# Patient Record
Sex: Male | Born: 1960 | Race: White | Hispanic: No | Smoking: Never smoker
Health system: Southern US, Community
[De-identification: ages and names within clinical notes are randomized; demographics above are authoritative.]

## PROBLEM LIST (undated history)

## (undated) DIAGNOSIS — R7989 Other specified abnormal findings of blood chemistry: Secondary | ICD-10-CM

## (undated) DIAGNOSIS — E079 Disorder of thyroid, unspecified: Secondary | ICD-10-CM

## (undated) DIAGNOSIS — K5790 Diverticulosis of intestine, part unspecified, without perforation or abscess without bleeding: Secondary | ICD-10-CM

## (undated) HISTORY — PX: COLONOSCOPY: SHX174

## (undated) HISTORY — DX: Other specified abnormal findings of blood chemistry: R79.89

## (undated) HISTORY — DX: Diverticulosis of intestine, part unspecified, without perforation or abscess without bleeding: K57.90

## (undated) HISTORY — PX: WISDOM TOOTH EXTRACTION: SHX21

## (undated) HISTORY — PX: LUMBAR LAMINECTOMY: SHX95

## (undated) HISTORY — DX: Disorder of thyroid, unspecified: E07.9

---

## 1998-01-27 ENCOUNTER — Emergency Department (HOSPITAL_COMMUNITY): Admission: EM | Admit: 1998-01-27 | Discharge: 1998-01-27 | Payer: Self-pay | Admitting: Emergency Medicine

## 1998-01-27 ENCOUNTER — Encounter: Payer: Self-pay | Admitting: Emergency Medicine

## 2000-02-16 ENCOUNTER — Emergency Department (HOSPITAL_COMMUNITY): Admission: EM | Admit: 2000-02-16 | Discharge: 2000-02-16 | Payer: Self-pay | Admitting: Emergency Medicine

## 2003-03-27 ENCOUNTER — Emergency Department (HOSPITAL_COMMUNITY): Admission: EM | Admit: 2003-03-27 | Discharge: 2003-03-27 | Payer: Self-pay | Admitting: Emergency Medicine

## 2003-04-07 ENCOUNTER — Ambulatory Visit (HOSPITAL_COMMUNITY): Admission: RE | Admit: 2003-04-07 | Discharge: 2003-04-08 | Payer: Self-pay | Admitting: Neurosurgery

## 2004-12-03 ENCOUNTER — Ambulatory Visit: Payer: Self-pay | Admitting: Gastroenterology

## 2004-12-24 ENCOUNTER — Ambulatory Visit: Payer: Self-pay | Admitting: Gastroenterology

## 2005-06-25 ENCOUNTER — Emergency Department (HOSPITAL_COMMUNITY): Admission: EM | Admit: 2005-06-25 | Discharge: 2005-06-25 | Payer: Self-pay | Admitting: Emergency Medicine

## 2006-07-26 ENCOUNTER — Emergency Department (HOSPITAL_COMMUNITY): Admission: EM | Admit: 2006-07-26 | Discharge: 2006-07-26 | Payer: Self-pay | Admitting: Emergency Medicine

## 2007-11-02 DIAGNOSIS — K5730 Diverticulosis of large intestine without perforation or abscess without bleeding: Secondary | ICD-10-CM | POA: Insufficient documentation

## 2007-11-06 ENCOUNTER — Ambulatory Visit: Payer: Self-pay | Admitting: Gastroenterology

## 2007-11-06 DIAGNOSIS — R5383 Other fatigue: Secondary | ICD-10-CM

## 2007-11-06 DIAGNOSIS — R198 Other specified symptoms and signs involving the digestive system and abdomen: Secondary | ICD-10-CM | POA: Insufficient documentation

## 2007-11-06 DIAGNOSIS — R5381 Other malaise: Secondary | ICD-10-CM | POA: Insufficient documentation

## 2007-11-06 DIAGNOSIS — K5900 Constipation, unspecified: Secondary | ICD-10-CM | POA: Insufficient documentation

## 2007-11-06 LAB — CONVERTED CEMR LAB
ALT: 57 units/L — ABNORMAL HIGH (ref 0–53)
AST: 28 units/L (ref 0–37)
Albumin: 4.2 g/dL (ref 3.5–5.2)
Alkaline Phosphatase: 105 units/L (ref 39–117)
BUN: 9 mg/dL (ref 6–23)
Basophils Absolute: 0.1 10*3/uL (ref 0.0–0.1)
Basophils Relative: 1.1 % (ref 0.0–3.0)
CO2: 32 meq/L (ref 19–32)
Calcium: 9.5 mg/dL (ref 8.4–10.5)
Chloride: 107 meq/L (ref 96–112)
Creatinine, Ser: 1 mg/dL (ref 0.4–1.5)
Eosinophils Absolute: 0.1 10*3/uL (ref 0.0–0.7)
Eosinophils Relative: 1.7 % (ref 0.0–5.0)
GFR calc Af Amer: 104 mL/min
GFR calc non Af Amer: 86 mL/min
Glucose, Bld: 94 mg/dL (ref 70–99)
HCT: 44 % (ref 39.0–52.0)
Hemoglobin: 15.5 g/dL (ref 13.0–17.0)
Lymphocytes Relative: 26.7 % (ref 12.0–46.0)
MCHC: 35.3 g/dL (ref 30.0–36.0)
MCV: 89.9 fL (ref 78.0–100.0)
Monocytes Absolute: 0.4 10*3/uL (ref 0.1–1.0)
Monocytes Relative: 7.1 % (ref 3.0–12.0)
Neutro Abs: 3.1 10*3/uL (ref 1.4–7.7)
Neutrophils Relative %: 63.4 % (ref 43.0–77.0)
Platelets: 193 10*3/uL (ref 150–400)
Potassium: 4.9 meq/L (ref 3.5–5.1)
RBC: 4.89 M/uL (ref 4.22–5.81)
RDW: 12.2 % (ref 11.5–14.6)
Sodium: 143 meq/L (ref 135–145)
TSH: 0.7 microintl units/mL (ref 0.35–5.50)
Total Bilirubin: 0.7 mg/dL (ref 0.3–1.2)
Total Protein: 7 g/dL (ref 6.0–8.3)
WBC: 5.1 10*3/uL (ref 4.5–10.5)

## 2007-11-13 ENCOUNTER — Ambulatory Visit: Payer: Self-pay | Admitting: Gastroenterology

## 2008-08-07 ENCOUNTER — Ambulatory Visit: Payer: Self-pay | Admitting: Gastroenterology

## 2008-08-07 LAB — CONVERTED CEMR LAB
ALT: 45 units/L (ref 0–53)
AST: 34 units/L (ref 0–37)
Albumin: 4.2 g/dL (ref 3.5–5.2)
Alkaline Phosphatase: 85 units/L (ref 39–117)
Basophils Absolute: 0 10*3/uL (ref 0.0–0.1)
Basophils Relative: 0.5 % (ref 0.0–3.0)
Bilirubin, Direct: 0.1 mg/dL (ref 0.0–0.3)
Eosinophils Absolute: 0.1 10*3/uL (ref 0.0–0.7)
Eosinophils Relative: 1.4 % (ref 0.0–5.0)
HCT: 44.3 % (ref 39.0–52.0)
Hemoglobin: 15.6 g/dL (ref 13.0–17.0)
Lymphocytes Relative: 25.9 % (ref 12.0–46.0)
Lymphs Abs: 1.2 10*3/uL (ref 0.7–4.0)
MCHC: 35.3 g/dL (ref 30.0–36.0)
MCV: 90.5 fL (ref 78.0–100.0)
Monocytes Absolute: 0.4 10*3/uL (ref 0.1–1.0)
Monocytes Relative: 7.7 % (ref 3.0–12.0)
Neutro Abs: 2.9 10*3/uL (ref 1.4–7.7)
Neutrophils Relative %: 64.5 % (ref 43.0–77.0)
Platelets: 161 10*3/uL (ref 150.0–400.0)
RBC: 4.9 M/uL (ref 4.22–5.81)
RDW: 12.2 % (ref 11.5–14.6)
Total Bilirubin: 1 mg/dL (ref 0.3–1.2)
Total Protein: 7.1 g/dL (ref 6.0–8.3)
WBC: 4.6 10*3/uL (ref 4.5–10.5)

## 2010-07-16 NOTE — Op Note (Signed)
NAME:  Grant Cunningham, Grant Cunningham                         ACCOUNT NO.:  [id number]   MEDICAL RECORD NO.:  [id number]                   PATIENT TYPE:  OIB   LOCATION:  6715                                 FACILITY:  [facility]   PHYSICIAN:  Kathaleen Maser. Pool, M.D.                 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   PROCEDURE:  Reexploration of right L5-S1 laminotomy with redo  microdiskectomy.   SURGEON:  Kathaleen Maser. Pool, M.D.   ASSISTANT:  Donalee Citrin, M.D.   ANESTHESIA:  General.   INDICATIONS FOR PROCEDURE:  [name] is a 50 year old male with a history  of  a previous  right-sided L5-S1 laminotomy and microdiskectomy remotely  done in the past. The patient presents now with  acute recurrence of his  right lower extremity radicular pain, paresthesias and weakness because of  the right-sided S1 radiculopathy. MRI scanning demonstrates evidence of a  focal recurrent disk  herniation at L5-S1 causing  marked compression of the  right-sided S1 nerve root. We discussed  the options of operative  management. The patient decided to proceed with a right-sided L5-S1  laminotomy and microdiskectomy in hopes of improvement of his symptoms.   DESCRIPTION OF PROCEDURE:  The patient was brought to the operating room and  placed in the supine position. An adequate level of anesthesia was achieved.  The patient was placed prone onto the Wilson frame and artery padded. The  patient's lumbar region was prepped and draped sterilely.   A #10 blade was used to make a linear skin incision around the L5-S1  interspace. This was carried down sharply in the midline. Subperiosteal  dissection was performed to the lamina of the facet  joint of L5 and S1 on  the right side. A deep self-retaining retractor was placed. Intraoperative x-  ray was taken and the level  was confirmed.   The patient's previous laminotomy site was then dissected free using dental  instruments. The microscope was brought into the field and used for  microdissection of the right-sided nerve root and underlying disk  herniation. The epidural venous plexus was coagulated and cut.   The right-sided nerve root was identified just medial to the right-sided S1  pedicle. This was gently mobilized toward the midline. A moderately sized  free fragment disk  herniation  was encountered and dissected free. This was  then removed using pituitary rongeurs. The disk  itself was quite flat.  There was no obvious defect in the annulus. The MRI scan was reviewed once  again and the overall disk  morphology looked good.   With that in mind, it was decided not to  do any more of a formal disk  cleanout. The canal was inspected and found to be free from any further  compression or evidence of injury to the  thecal sac or nerve roots. The  wound was then irrigated with antibiotic solution and Gelfoam was placed for  operative hemostasis which was found to be good.   The wound was then closed in the typical fashion. Steri-Strips and sterile  dressing were applied to the surface. There were no apparent complications.  The patient tolerated the procedure well and he returned to the recovery  room postoperatively.                                               Henry A. Pool, M.D.    HAP/MEDQ  D:  04/07/2003  T:  04/08/2003  Job:  [id number]

## 2010-10-25 ENCOUNTER — Encounter: Payer: Self-pay | Admitting: Gastroenterology

## 2010-11-05 ENCOUNTER — Other Ambulatory Visit: Payer: Self-pay | Admitting: Gastroenterology

## 2010-11-18 ENCOUNTER — Encounter: Payer: Self-pay | Admitting: Gastroenterology

## 2010-11-18 ENCOUNTER — Ambulatory Visit (AMBULATORY_SURGERY_CENTER): Payer: BC Managed Care – PPO | Admitting: *Deleted

## 2010-11-18 VITALS — Ht 72.0 in | Wt 184.1 lb

## 2010-11-18 DIAGNOSIS — Z1211 Encounter for screening for malignant neoplasm of colon: Secondary | ICD-10-CM

## 2010-11-18 MED ORDER — PEG-KCL-NACL-NASULF-NA ASC-C 100 G PO SOLR: ORAL | Status: DC

## 2010-12-02 ENCOUNTER — Encounter: Payer: Self-pay | Admitting: Gastroenterology

## 2010-12-02 ENCOUNTER — Ambulatory Visit (AMBULATORY_SURGERY_CENTER): Payer: BC Managed Care – PPO | Admitting: Gastroenterology

## 2010-12-02 VITALS — BP 118/70 | HR 80 | Temp 97.4°F | Resp 20 | Ht 72.0 in | Wt 184.0 lb

## 2010-12-02 DIAGNOSIS — Z1211 Encounter for screening for malignant neoplasm of colon: Secondary | ICD-10-CM

## 2010-12-02 DIAGNOSIS — Z800 Family history of malignant neoplasm of digestive organs: Secondary | ICD-10-CM

## 2010-12-02 MED ORDER — SODIUM CHLORIDE 0.9 % IV SOLN: 500.0000 mL | INTRAVENOUS | Status: DC

## 2010-12-02 NOTE — Patient Instructions (Signed)
Please refer to your blue and neon green sheets for instructions regarding diet and activity for the rest of today.  You may resume your medications as you would normally take them.   Diverticulosis Diverticulosis is a common condition that develops when small pouches (diverticula) form in the wall of the colon . The risk of diverticulosis increases with age. It happens more often in people who eat a low-fiber diet. Most individuals with diverticulosis have no symptoms. Those individuals with symptoms usually experience belly (abdominal) pain, constipation, or loose stools (diarrhea).  Eating a high-fiber diet has been shown to reduce discomfort and other symptoms of diverticulosis. Fiber may also slow the progression of diverticulosis. Foods having high fiber content include:  Baked beans, kidney beans, split peas, lentils.   Bran cereals, shredded wheat, bran muffins, whole grain breads.   Fresh fruit, raisins, prunes.   Potatoes (with skin), broccoli, spinach, zucchini.  You may feel a little bloated when you introduce more fiber into your diet. These symptoms usually pass in time. Drink at least 6 to 8 glasses of water each day to prevent constipation. Try not to strain when you have a bowel movement. Some caregivers may recommend avoiding nuts and seeds to prevent complications of diverticulosis, although this is still an uncertain benefit. Mild pain medicine may help soothe pain and spasms. Only take over-the-counter or prescription medicines for pain, discomfort, or fever as directed by your caregiver. Some complications of diverticulosis include an infection of the diverticula (diverticulitis), rectal bleeding, or blockage of the colon (bowel obstruction). SEEK IMMEDIATE MEDICAL CARE IF:  You develop increasing pain or severe bloating.   You have an increased oral temperature, not controlled by medicine.   You develop vomiting or bowel movements that are bloody or black.  Document  Released: 02/14/2005 Document Re-Released: 08/04/2009 [facility] Patient Information 2011 [place], [place].

## 2010-12-03 ENCOUNTER — Telehealth: Payer: Self-pay | Admitting: *Deleted

## 2010-12-03 NOTE — Telephone Encounter (Signed)

## 2012-03-12 ENCOUNTER — Other Ambulatory Visit: Payer: Self-pay | Admitting: Neurosurgery

## 2012-03-12 DIAGNOSIS — M47812 Spondylosis without myelopathy or radiculopathy, cervical region: Secondary | ICD-10-CM

## 2012-03-15 ENCOUNTER — Ambulatory Visit
Admission: RE | Admit: 2012-03-15 | Discharge: 2012-03-15 | Disposition: A | Discharge status: Home / Self Care | Payer: BC Managed Care – PPO | Source: Ambulatory Visit | Attending: Neurosurgery | Admitting: Neurosurgery

## 2012-03-15 DIAGNOSIS — M47812 Spondylosis without myelopathy or radiculopathy, cervical region: Secondary | ICD-10-CM

## 2013-10-25 ENCOUNTER — Encounter: Payer: Self-pay | Admitting: Gastroenterology

## 2014-02-12 ENCOUNTER — Ambulatory Visit (AMBULATORY_SURGERY_CENTER): Payer: Self-pay | Admitting: *Deleted

## 2014-02-12 VITALS — Ht 72.0 in | Wt 180.4 lb

## 2014-02-12 DIAGNOSIS — Z800 Family history of malignant neoplasm of digestive organs: Secondary | ICD-10-CM

## 2014-02-12 MED ORDER — MOVIPREP 100 G PO SOLR: 1.0000 | Freq: Once | ORAL | Status: DC

## 2014-02-12 NOTE — Progress Notes (Signed)
No egg or soy allergy. ewm No problems with past sedation. ewm No home 02 use. ewm No diet pills, no blood thinners. ewm emmi video to pt;;s e mail. ewm

## 2014-02-26 ENCOUNTER — Ambulatory Visit (AMBULATORY_SURGERY_CENTER): Payer: BC Managed Care – PPO | Admitting: Gastroenterology

## 2014-02-26 ENCOUNTER — Encounter: Payer: Self-pay | Admitting: Gastroenterology

## 2014-02-26 VITALS — BP 111/65 | HR 67 | Temp 97.5°F | Resp 23 | Ht 72.0 in | Wt 180.0 lb

## 2014-02-26 DIAGNOSIS — Z1211 Encounter for screening for malignant neoplasm of colon: Secondary | ICD-10-CM

## 2014-02-26 DIAGNOSIS — Z800 Family history of malignant neoplasm of digestive organs: Secondary | ICD-10-CM

## 2014-02-26 MED ORDER — SODIUM CHLORIDE 0.9 % IV SOLN: 500.0000 mL | INTRAVENOUS | Status: DC

## 2014-02-26 NOTE — Patient Instructions (Signed)

## 2014-02-26 NOTE — Progress Notes (Signed)
No problems noted in the recovery room. maw 

## 2014-02-26 NOTE — Op Note (Signed)
[facility]  [address] [place], [id number]   COLONOSCOPY PROCEDURE REPORT  PATIENT: Grant Cunningham, Grant Cunningham  MR#: [id number] BIRTHDATE: 1960-05-14 , 73  yrs. old GENDER: male ENDOSCOPIST: Ladene Artist, MD, [facility] PROCEDURE DATE:  02/26/2014 PROCEDURE:   Colonoscopy, screening First Screening Colonoscopy - Avg.  risk and is 50 yrs.  old or older - No.  Prior Negative Screening - Now for repeat screening. N/A  History of Adenoma - Now for follow-up colonoscopy & has been > or = to 3 yrs.  N/A  Polyps Removed Today? No.  Polyps Removed Today? No.  Recommend repeat exam, <10 yrs? Polyps Removed Today? No.  Recommend repeat exam, <10 yrs? Yes.  Polyps Removed Today? No.  Recommend repeat exam, <10 yrs? Yes.  High risk (family or personal hx). ASA CLASS:   Class II INDICATIONS:patient's immediate family history of colon cancer-mother and brother MEDICATIONS: Monitored anesthesia care and Propofol 200 mg IV DESCRIPTION OF PROCEDURE:   After the risks benefits and alternatives of the procedure were thoroughly explained, informed consent was obtained.  The digital rectal exam revealed no abnormalities of the rectum.   The LB [id number]  endoscope was introduced through the anus and advanced to the cecum, which was identified by both the appendix and ileocecal valve. No adverse events experienced.   The quality of the prep was good, using MoviPrep  The instrument was then slowly withdrawn as the colon was fully examined.  COLON FINDINGS: There was mild diverticulosis noted in the sigmoid colon and at the hepatic flexure.   The examination was otherwise normal.  Retroflexed views revealed no abnormalities. The time to cecum=3 minutes 56 seconds.  Withdrawal time=10 minutes 03 seconds. The scope was withdrawn and the procedure completed. COMPLICATIONS: There were no immediate complications.  ENDOSCOPIC IMPRESSION: 1.   Mild diverticulosis in the sigmoid colon and at  the hepatic flexure 2.   The examination was otherwise normal  RECOMMENDATIONS: 1.  High fiber diet with liberal fluid intake. 2.  Repeat Colonoscopy in 5 years.  eSigned:  Ladene Artist, MD, [facility] 02/26/2014 12:15 PM

## 2014-02-26 NOTE — Progress Notes (Signed)
Report to PACU, RN, vss, BBS= Clear.  

## 2014-03-03 ENCOUNTER — Telehealth: Payer: Self-pay | Admitting: *Deleted

## 2014-03-03 NOTE — Telephone Encounter (Signed)
  Follow up Call-  Call back number 02/26/2014  Post procedure Call Back phone  # [phone]  Permission to leave phone message Yes     Patient questions:  Do you have a fever, pain , or abdominal swelling? No. Pain Score  0 *  Have you tolerated food without any problems? Yes.    Have you been able to return to your normal activities? Yes.    Do you have any questions about your discharge instructions: Diet   No. Medications  No. Follow up visit  No.  Do you have questions or concerns about your Care? No.  Actions: * If pain score is 4 or above: No action needed, pain <4.

## 2014-05-15 ENCOUNTER — Encounter (HOSPITAL_BASED_OUTPATIENT_CLINIC_OR_DEPARTMENT_OTHER): Payer: Self-pay | Admitting: *Deleted

## 2014-05-15 ENCOUNTER — Emergency Department (HOSPITAL_BASED_OUTPATIENT_CLINIC_OR_DEPARTMENT_OTHER)
Admission: EM | Admit: 2014-05-15 | Discharge: 2014-05-15 | Disposition: A | Discharge status: Home / Self Care | Payer: BLUE CROSS/BLUE SHIELD | Attending: Emergency Medicine | Admitting: Emergency Medicine

## 2014-05-15 DIAGNOSIS — Y998 Other external cause status: Secondary | ICD-10-CM | POA: Insufficient documentation

## 2014-05-15 DIAGNOSIS — S0101XA Laceration without foreign body of scalp, initial encounter: Secondary | ICD-10-CM | POA: Diagnosis not present

## 2014-05-15 DIAGNOSIS — Z23 Encounter for immunization: Secondary | ICD-10-CM | POA: Insufficient documentation

## 2014-05-15 DIAGNOSIS — Y9389 Activity, other specified: Secondary | ICD-10-CM | POA: Insufficient documentation

## 2014-05-15 DIAGNOSIS — Y9289 Other specified places as the place of occurrence of the external cause: Secondary | ICD-10-CM | POA: Diagnosis not present

## 2014-05-15 DIAGNOSIS — W228XXA Striking against or struck by other objects, initial encounter: Secondary | ICD-10-CM | POA: Diagnosis not present

## 2014-05-15 DIAGNOSIS — Z79899 Other long term (current) drug therapy: Secondary | ICD-10-CM | POA: Diagnosis not present

## 2014-05-15 MED ORDER — LIDOCAINE-EPINEPHRINE 2 %-1:100000 IJ SOLN
10.0000 mL | Freq: Once | INTRAMUSCULAR | Status: AC
  Administered 2014-05-15: 10 mL via INTRADERMAL
  Filled 2014-05-15: qty 1

## 2014-05-15 MED ORDER — TRAMADOL HCL 50 MG PO TABS: 50.0000 mg | ORAL_TABLET | Freq: Four times a day (QID) | ORAL | Status: DC | PRN

## 2014-05-15 MED ORDER — TETANUS-DIPHTH-ACELL PERTUSSIS 5-2.5-18.5 LF-MCG/0.5 IM SUSP
0.5000 mL | Freq: Once | INTRAMUSCULAR | Status: AC
  Administered 2014-05-15: 0.5 mL via INTRAMUSCULAR
  Filled 2014-05-15: qty 0.5

## 2014-05-15 NOTE — ED Notes (Addendum)
Pt sts he was driving a T post into the ground when one side of the "T" struck him in the top of the head, lacerating him.

## 2014-05-15 NOTE — ED Notes (Signed)
Directed to pharmacy to pick up prescriptions

## 2014-05-15 NOTE — Discharge Instructions (Signed)
Please return in 5-7 days for staples removal.  Follow instruction below.    Laceration Care, Adult A laceration is a cut or lesion that goes through all layers of the skin and into the tissue just beneath the skin. TREATMENT  Some lacerations may not require closure. Some lacerations may not be able to be closed due to an increased risk of infection. It is important to see your caregiver as soon as possible after an injury to minimize the risk of infection and maximize the opportunity for successful closure. If closure is appropriate, pain medicines may be given, if needed. The wound will be cleaned to help prevent infection. Your caregiver will use stitches (sutures), staples, wound glue (adhesive), or skin adhesive strips to repair the laceration. These tools bring the skin edges together to allow for faster healing and a better cosmetic outcome. However, all wounds will heal with a scar. Once the wound has healed, scarring can be minimized by covering the wound with sunscreen during the day for 1 full year. HOME CARE INSTRUCTIONS  For sutures or staples:  Keep the wound clean and dry.  If you were given a bandage (dressing), you should change it at least once a day. Also, change the dressing if it becomes wet or dirty, or as directed by your caregiver.  Wash the wound with soap and water 2 times a day. Rinse the wound off with water to remove all soap. Pat the wound dry with a clean towel.  After cleaning, apply a thin layer of the antibiotic ointment as recommended by your caregiver. This will help prevent infection and keep the dressing from sticking.  You may shower as usual after the first 24 hours. Do not soak the wound in water until the sutures are removed.  Only take over-the-counter or prescription medicines for pain, discomfort, or fever as directed by your caregiver.  Get your sutures or staples removed as directed by your caregiver. For skin adhesive strips:  Keep the wound  clean and dry.  Do not get the skin adhesive strips wet. You may bathe carefully, using caution to keep the wound dry.  If the wound gets wet, pat it dry with a clean towel.  Skin adhesive strips will fall off on their own. You may trim the strips as the wound heals. Do not remove skin adhesive strips that are still stuck to the wound. They will fall off in time. For wound adhesive:  You may briefly wet your wound in the shower or bath. Do not soak or scrub the wound. Do not swim. Avoid periods of heavy perspiration until the skin adhesive has fallen off on its own. After showering or bathing, gently pat the wound dry with a clean towel.  Do not apply liquid medicine, cream medicine, or ointment medicine to your wound while the skin adhesive is in place. This may loosen the film before your wound is healed.  If a dressing is placed over the wound, be careful not to apply tape directly over the skin adhesive. This may cause the adhesive to be pulled off before the wound is healed.  Avoid prolonged exposure to sunlight or tanning lamps while the skin adhesive is in place. Exposure to ultraviolet light in the first year will darken the scar.  The skin adhesive will usually remain in place for 5 to 10 days, then naturally fall off the skin. Do not pick at the adhesive film. You may need a tetanus shot if:  You cannot  remember when you had your last tetanus shot.  You have never had a tetanus shot. If you get a tetanus shot, your arm may swell, get red, and feel warm to the touch. This is common and not a problem. If you need a tetanus shot and you choose not to have one, there is a rare chance of getting tetanus. Sickness from tetanus can be serious. SEEK MEDICAL CARE IF:   You have redness, swelling, or increasing pain in the wound.  You see a red line that goes away from the wound.  You have yellowish-white fluid (pus) coming from the wound.  You have a fever.  You notice a bad smell  coming from the wound or dressing.  Your wound breaks open before or after sutures have been removed.  You notice something coming out of the wound such as wood or glass.  Your wound is on your hand or foot and you cannot move a finger or toe. SEEK IMMEDIATE MEDICAL CARE IF:   Your pain is not controlled with prescribed medicine.  You have severe swelling around the wound causing pain and numbness or a change in color in your arm, hand, leg, or foot.  Your wound splits open and starts bleeding.  You have worsening numbness, weakness, or loss of function of any joint around or beyond the wound.  You develop painful lumps near the wound or on the skin anywhere on your body. MAKE SURE YOU:   Understand these instructions.  Will watch your condition.  Will get help right away if you are not doing well or get worse. Document Released: 02/14/2005 Document Revised: 05/09/2011 Document Reviewed: 08/10/2010 [facility] Patient Information 2015 [place], [place]. This information is not intended to replace advice given to you by your health care provider. Make sure you discuss any questions you have with your health care provider.

## 2014-05-15 NOTE — ED Provider Notes (Signed)
CSN: [id number]     Arrival date & time 05/15/14  1123 History   First MD Initiated Contact with Patient 05/15/14 1207     Chief Complaint  Patient presents with  . Head Laceration     (Consider location/radiation/quality/duration/timing/severity/associated sxs/prior Treatment) HPI   54 year old male who presents for evaluation of head injury. Pt was building a fence for his horse at home today and approximately 2 hours ago as he was driving a post to the ground he accidentally injured this vertex of his scalp when he lost control of his tool which struck him in the head.  He reports acute onset of sharp pain with bleeding. He was able to apply pressure to the bleed and come to the ER. He denies any loss of consciousness. His pain is minimal at this time. He does not recall his last tetanus status. He denies any other injury. No numbness weakness neck pain or any other symptoms. He is not on blood thinner medication.  History reviewed. No pertinent past medical history. Past Surgical History  Procedure Laterality Date  . Lumbar laminectomy  1999, 2005    L5, S1  . Colonoscopy    . Wisdom tooth extraction     Family History  Problem Relation Age of Onset  . Colon cancer Mother 29  . Colon cancer Brother 6  . Lung cancer Father   . Colon polyps Brother    History  Substance Use Topics  . Smoking status: Never Smoker   . Smokeless tobacco: Never Used  . Alcohol Use: No     Comment: rare- once every few months    Review of Systems  Constitutional: Negative for fever.  Skin: Positive for wound.  Neurological: Negative for headaches.      Allergies  Review of patient's allergies indicates no known allergies.  Home Medications   Prior to Admission medications   Medication Sig Start Date End Date Taking? Authorizing Provider  cholecalciferol (VITAMIN D) 400 UNITS TABS tablet Take 400 Units by mouth daily.    Historical Provider, MD  Cyanocobalamin (VITAMIN B 12 PO) Take  1 tablet by mouth daily.    Historical Provider, MD  Omega-3 Fatty Acids (SUPER OMEGA 3) 500 MG CAPS Take 1 capsule by mouth daily.    Historical Provider, MD  Testosterone 10 MG/ACT (2%) GEL Place onto the skin.    Historical Provider, MD   BP 118/72 mmHg  Pulse 92  Temp(Src) 98.5 F (36.9 C) (Oral)  Resp 18  Ht 6' (1.829 m)  Wt 175 lb (79.379 kg)  BMI 23.73 kg/m2  SpO2 98% Physical Exam  Constitutional: He appears well-developed and well-nourished. No distress.  HENT:  Head: Atraumatic.  2 cm vertical laceration noted to the vertex of scalp without crepitus or foreign object noted. Minimal tenderness to palpation, not actively bleeding.  Eyes: Conjunctivae are normal.  Neck: Normal range of motion. Neck supple.  Neurological: He is alert. He has normal strength. No cranial nerve deficit or sensory deficit. Gait normal. GCS eye subscore is 4. GCS verbal subscore is 5. GCS motor subscore is 6.  Skin: No rash noted.  Psychiatric: He has a normal mood and affect.    ED Course  Procedures (including critical care time)  Patient here with a mechanical injury, suffering a laceration to the vertex of scalp. Will give tdap, will clean scalp wound and staples.    LACERATION REPAIR Performed by: Domenic Moras Authorized byDomenic Moras Consent: Verbal consent obtained. Risks and benefits:  risks, benefits and alternatives were discussed Consent given by: patient Patient identity confirmed: provided demographic data Prepped and Draped in normal sterile fashion Wound explored  Laceration Location: vertex of scalp  Laceration Length: 2cm  No Foreign Bodies seen or palpated  Anesthesia: local infiltration  Local anesthetic: lidocaine 2% w epinephrine  Anesthetic total: 2 ml  Irrigation method: syringe Amount of cleaning: standard  Skin closure: surgical staples  Number of sutures: 3  Technique: staples  Patient tolerance: Patient tolerated the procedure well with no  immediate complications.   Labs Review Labs Reviewed - No data to display  Imaging Review No results found.   EKG Interpretation None      MDM   Final diagnoses:  Scalp laceration, initial encounter    BP 118/72 mmHg  Pulse 92  Temp(Src) 98.5 F (36.9 C) (Oral)  Resp 18  Ht 6' (1.829 m)  Wt 175 lb (79.379 kg)  BMI 23.73 kg/m2  SpO2 98%     Domenic Moras, PA-C 05/15/14 1258  Sherwood Gambler, MD 05/16/14 [phone]

## 2014-10-02 ENCOUNTER — Encounter: Payer: Self-pay | Admitting: Gastroenterology

## 2015-08-27 ENCOUNTER — Telehealth: Payer: Self-pay | Admitting: Gastroenterology

## 2015-08-27 NOTE — Telephone Encounter (Signed)
Change in bowels, more constipated, does not feel as if he emptys, stools are different sizes at times some are very small caliber some are larger.  No bleeding, no pain,   last colon   2015 ENDOSCOPIC IMPRESSION: 1. Mild diverticulosis in the sigmoid colon and at the hepatic flexure 2. The examination was otherwise normal RECOMMENDATIONS: 1. High fiber diet with liberal fluid intake. 2. Repeat Colonoscopy in 5 years. eSigned: Malcolm  Repeat colon in 5 years  Family history of colon cancer in mother and brother   Pt has been scheduled for appt with Dr Fuller Plan 11/04/15 he will call if he has any further symptoms develops or concerns.

## 2015-10-04 ENCOUNTER — Encounter (HOSPITAL_BASED_OUTPATIENT_CLINIC_OR_DEPARTMENT_OTHER): Payer: Self-pay | Admitting: *Deleted

## 2015-10-04 ENCOUNTER — Emergency Department (HOSPITAL_BASED_OUTPATIENT_CLINIC_OR_DEPARTMENT_OTHER)
Admission: EM | Admit: 2015-10-04 | Discharge: 2015-10-04 | Disposition: A | Discharge status: Home / Self Care | Payer: 59 | Attending: Emergency Medicine | Admitting: Emergency Medicine

## 2015-10-04 DIAGNOSIS — Z79899 Other long term (current) drug therapy: Secondary | ICD-10-CM | POA: Insufficient documentation

## 2015-10-04 DIAGNOSIS — K529 Noninfective gastroenteritis and colitis, unspecified: Secondary | ICD-10-CM | POA: Diagnosis not present

## 2015-10-04 DIAGNOSIS — R112 Nausea with vomiting, unspecified: Secondary | ICD-10-CM | POA: Diagnosis present

## 2015-10-04 LAB — COMPREHENSIVE METABOLIC PANEL
ALT: 46 U/L (ref 17–63)
AST: 26 U/L (ref 15–41)
Albumin: 4.5 g/dL (ref 3.5–5.0)
Alkaline Phosphatase: 82 U/L (ref 38–126)
Anion gap: 9 (ref 5–15)
BUN: 17 mg/dL (ref 6–20)
CHLORIDE: 101 mmol/L (ref 101–111)
CO2: 26 mmol/L (ref 22–32)
CREATININE: 1.09 mg/dL (ref 0.61–1.24)
Calcium: 9.2 mg/dL (ref 8.9–10.3)
GFR calc Af Amer: >60 mL/min (ref >60)
GFR calc non Af Amer: >60 mL/min (ref >60)
Glucose, Bld: 148 mg/dL — ABNORMAL HIGH (ref 65–99)
Potassium: 4 mmol/L (ref 3.5–5.1)
SODIUM: 136 mmol/L (ref 135–145)
Total Bilirubin: 1.1 mg/dL (ref 0.3–1.2)
Total Protein: 7.6 g/dL (ref 6.5–8.1)

## 2015-10-04 LAB — CBC
HCT: 46.5 % (ref 39.0–52.0)
Hemoglobin: 16.6 g/dL (ref 13.0–17.0)
MCH: 31.2 pg (ref 26.0–34.0)
MCHC: 35.7 g/dL (ref 30.0–36.0)
MCV: 87.4 fL (ref 78.0–100.0)
PLATELETS: 159 10*3/uL (ref 150–400)
RBC: 5.32 MIL/uL (ref 4.22–5.81)
RDW: 12.7 % (ref 11.5–15.5)
WBC: 11 10*3/uL — ABNORMAL HIGH (ref 4.0–10.5)

## 2015-10-04 LAB — URINALYSIS, ROUTINE W REFLEX MICROSCOPIC
Bilirubin Urine: NEGATIVE
GLUCOSE, UA: NEGATIVE mg/dL
HGB URINE DIPSTICK: NEGATIVE
KETONES UR: 15 mg/dL — AB
Leukocytes, UA: NEGATIVE
Nitrite: NEGATIVE
PROTEIN: NEGATIVE mg/dL
Specific Gravity, Urine: 1.025 (ref 1.005–1.030)
pH: 6.5 (ref 5.0–8.0)

## 2015-10-04 LAB — LIPASE, BLOOD: LIPASE: 24 U/L (ref 11–51)

## 2015-10-04 MED ORDER — ONDANSETRON 4 MG PO TBDP: ORAL_TABLET | ORAL | 0 refills | Status: DC

## 2015-10-04 MED ORDER — ONDANSETRON HCL 4 MG/2ML IJ SOLN
4.0000 mg | Freq: Once | INTRAMUSCULAR | Status: DC | PRN
  Filled 2015-10-04: qty 2

## 2015-10-04 MED ORDER — SODIUM CHLORIDE 0.9 % IV BOLUS (SEPSIS)
1000.0000 mL | Freq: Once | INTRAVENOUS | Status: AC
  Administered 2015-10-04: 1000 mL via INTRAVENOUS

## 2015-10-04 MED ORDER — ONDANSETRON HCL 4 MG/2ML IJ SOLN
4.0000 mg | Freq: Once | INTRAMUSCULAR | Status: AC
  Administered 2015-10-04: 4 mg via INTRAVENOUS

## 2015-10-04 NOTE — ED Provider Notes (Signed)
[facility] DEPT [facility] Provider Note   CSN: [id number] Arrival date & time: 10/04/15  1130  First Provider Contact:  First MD Initiated Contact with Patient 10/04/15 1137        History   Chief Complaint Chief Complaint  Patient presents with  . Diarrhea  . Emesis    HPI Grant Cunningham is a 55 y.o. male.  Patient presents with nausea vomiting and diarrhea. He states he ate some pizza last night and started having diarrhea during the night. He had large amounts of watery, nonbloody diarrhea. This morning he started having nausea and vomiting. He's had multiple episodes of nonbilious, nonbloody vomiting. He denies any abdominal pain. He felt like his heart was racing which is the reason he came to the emergency department. He feels a little bit lightheaded. No chest pain or shortness of breath. No known fevers.      History reviewed. No pertinent past medical history.  Patient Active Problem List   Diagnosis Date Noted  . CONSTIPATION 11/06/2007  . FATIGUE 11/06/2007  . CHANGE IN BOWELS 11/06/2007  . DIVERTICULOSIS OF COLON 11/02/2007    Past Surgical History:  Procedure Laterality Date  . BACK SURGERY    . COLONOSCOPY    . LUMBAR LAMINECTOMY  1999, 2005   L5, S1  . WISDOM TOOTH EXTRACTION         Home Medications    Prior to Admission medications   Medication Sig Start Date End Date Taking? Authorizing Provider  Testosterone 10 MG/ACT (2%) GEL Place onto the skin.   Yes Historical Provider, MD  cholecalciferol (VITAMIN D) 400 UNITS TABS tablet Take 400 Units by mouth daily.    Historical Provider, MD  Cyanocobalamin (VITAMIN B 12 PO) Take 1 tablet by mouth daily.    Historical Provider, MD  Omega-3 Fatty Acids (SUPER OMEGA 3) 500 MG CAPS Take 1 capsule by mouth daily.    Historical Provider, MD  ondansetron (ZOFRAN ODT) 4 MG disintegrating tablet 4mg  ODT q4 hours prn nausea/vomit 10/04/15   Malvin Johns, MD  traMADol (ULTRAM) 50 MG tablet Take 1 tablet (50 mg  total) by mouth every 6 (six) hours as needed for moderate pain. 05/15/14   Domenic Moras, PA-C    Family History Family History  Problem Relation Age of Onset  . Colon cancer Mother 64  . Lung cancer Father   . Colon cancer Brother 65  . Colon polyps Brother     Social History Social History  Substance Use Topics  . Smoking status: Never Smoker  . Smokeless tobacco: Never Used  . Alcohol use No     Comment: rare- once every few months     Allergies   Review of patient's allergies indicates no known allergies.   Review of Systems Review of Systems  Constitutional: Negative for chills, diaphoresis, fatigue and fever.  HENT: Negative for congestion, rhinorrhea and sneezing.   Eyes: Negative.   Respiratory: Negative for cough, chest tightness and shortness of breath.   Cardiovascular: Positive for palpitations. Negative for chest pain and leg swelling.  Gastrointestinal: Positive for diarrhea, nausea and vomiting. Negative for abdominal pain and blood in stool.  Genitourinary: Negative for difficulty urinating, flank pain, frequency and hematuria.  Musculoskeletal: Negative for arthralgias and back pain.  Skin: Negative for rash.  Neurological: Negative for dizziness, speech difficulty, weakness, numbness and headaches.     Physical Exam Updated Vital Signs BP 106/62   Pulse 95   Temp 98.9 F (37.2 C) (  Oral)   Resp 12   Ht 6' (1.829 m)   Wt 180 lb (81.6 kg)   SpO2 98%   BMI 24.41 kg/m   Physical Exam  Constitutional: He is oriented to person, place, and time. He appears well-developed and well-nourished.  HENT:  Head: Normocephalic and atraumatic.  Eyes: Pupils are equal, round, and reactive to light.  Neck: Normal range of motion. Neck supple.  Cardiovascular: Regular rhythm and normal heart sounds.  Tachycardia present.   Pulmonary/Chest: Effort normal and breath sounds normal. No respiratory distress. He has no wheezes. He has no rales. He exhibits no  tenderness.  Abdominal: Soft. Bowel sounds are normal. There is no tenderness. There is no rebound and no guarding.  Musculoskeletal: Normal range of motion. He exhibits no edema.  Lymphadenopathy:    He has no cervical adenopathy.  Neurological: He is alert and oriented to person, place, and time.  Skin: Skin is warm and dry. No rash noted.  Psychiatric: He has a normal mood and affect.     ED Treatments / Results  Labs (all labs ordered are listed, but only abnormal results are displayed) Labs Reviewed  COMPREHENSIVE METABOLIC PANEL - Abnormal; Notable for the following:       Result Value   Glucose, Bld 148 (*)    All other components within normal limits  CBC - Abnormal; Notable for the following:    WBC 11.0 (*)    All other components within normal limits  URINALYSIS, ROUTINE W REFLEX MICROSCOPIC (NOT AT [facility]) - Abnormal; Notable for the following:    Ketones, ur 15 (*)    All other components within normal limits  LIPASE, BLOOD    EKG  EKG Interpretation  Date/Time:  Sunday October 04 2015 11:45:05 EDT Ventricular Rate:  120 PR Interval:    QRS Duration: 87 QT Interval:  322 QTC Calculation: 455 R Axis:   -88 Text Interpretation:  Sinus tachycardia Ventricular premature complex Left axis deviation Probable anterior infarct, old Confirmed by Lita Mains  MD, DAVID ([id number]), editor Rolla Plate, Joelene Millin [phone]) on 10/04/2015 11:54:18 AM       Radiology No results found.  Procedures Procedures (including critical care time)  Medications Ordered in ED Medications  ondansetron (ZOFRAN) injection 4 mg (not administered)  sodium chloride 0.9 % bolus 1,000 mL (1,000 mLs Intravenous New Bag/Given 10/04/15 1437)  ondansetron (ZOFRAN) injection 4 mg (4 mg Intravenous Given 10/04/15 1437)     Initial Impression / Assessment and Plan / ED Course  I have reviewed the triage vital signs and the nursing notes.  Pertinent labs & imaging results that were available during my care of  the patient were reviewed by me and considered in my medical decision making (see chart for details).  Clinical Course    Patient is feeling better after IV fluids and medications in the ED. He's had no further episodes of vomiting. He's tolerating oral fluids. He has no abdominal pain on exam. His labs are non-concerning. I did advise him that his glucose is mildly elevated and he'll need follow-up unless by his PCP. He was advised to return if he has any worsening symptoms.  Final Clinical Impressions(s) / ED Diagnoses   Final diagnoses:  Gastroenteritis    New Prescriptions New Prescriptions   ONDANSETRON (ZOFRAN ODT) 4 MG DISINTEGRATING TABLET    4mg  ODT q4 hours prn nausea/vomit     Malvin Johns, MD 10/04/15 1519

## 2015-10-04 NOTE — ED Triage Notes (Signed)
Pt reports diarrhea started last night after eating pizza then reports N/V starting this morning. Pt reports feeling heart palpitations this morning.

## 2015-11-03 ENCOUNTER — Ambulatory Visit (INDEPENDENT_AMBULATORY_CARE_PROVIDER_SITE_OTHER): Payer: 59 | Admitting: Gastroenterology

## 2015-11-03 ENCOUNTER — Encounter (INDEPENDENT_AMBULATORY_CARE_PROVIDER_SITE_OTHER): Payer: Self-pay

## 2015-11-03 ENCOUNTER — Encounter: Payer: Self-pay | Admitting: Gastroenterology

## 2015-11-03 VITALS — BP 100/40 | HR 68 | Ht 72.0 in | Wt 182.4 lb

## 2015-11-03 DIAGNOSIS — K6289 Other specified diseases of anus and rectum: Secondary | ICD-10-CM | POA: Diagnosis not present

## 2015-11-03 DIAGNOSIS — Z800 Family history of malignant neoplasm of digestive organs: Secondary | ICD-10-CM

## 2015-11-03 NOTE — Progress Notes (Signed)
    History of Present Illness: This is a 55 year old male who relates several episodes of very brief rectal pain that has occurred while sitting or walking. The symptoms only last a few seconds. They're not associated with bowel movements or any other digestive function. He has a long history of slight variations in his stool caliber, occasional diarrhea and these symptoms have not changed. He is concerned because of his family history of colon cancer. Colonoscopy performed within the last 2 years as listed below. Denies weight loss, abdominal pain, melena, hematochezia, nausea, vomiting, dysphagia, reflux symptoms, chest pain.   Colonoscopy 01/2014 ENDOSCOPIC IMPRESSION: 1. Mild diverticulosis in the sigmoid colon and at the hepatic flexure 2. The examination was otherwise normal  Medications, Allergies, Past Medical History, Past Surgical History, Family History and Social History were reviewed in [employer] record.  Physical Exam: General: Well developed, well nourished, no acute distress Head: Normocephalic and atraumatic Eyes:  sclerae anicteric, EOMI Ears: Normal auditory acuity Mouth: No deformity or lesions Lungs: Clear throughout to auscultation Heart: Regular rate and rhythm; no murmurs, rubs or bruits Abdomen: Soft, non tender and non distended. No masses, hepatosplenomegaly or hernias noted. Normal Bowel sounds Musculoskeletal: Symmetrical with no gross deformities  Pulses:  Normal pulses noted Extremities: No clubbing, cyanosis, edema or deformities noted Neurological: Alert oriented x 4, grossly nonfocal Psychological:  Alert and cooperative. Normal mood and affect  Assessment and Recommendations:  1. Brief episode rectal pain. 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 Patient reassured. If symptoms become more prolonged and more severe or new gastrointestinal complaints develop he is advised to return for follow-up.  2. Long-standing variable bowel habits with  occasional thinner stools and diarrhea. Patient can minimize caffeine and lactose. If he associates any food with his symptoms he is advised to avoid. Maintain a high-fiber diet with adequate daily fluid intake. If symptoms become more prolonged and more severe or new gastrointestinal complaints develop he is advised to return for follow-up.  3. Family history of colon cancer-mother and brother. Five-year interval surveillance colonoscopy is recommended in December 2020.

## 2015-11-03 NOTE — Patient Instructions (Signed)
You will be due for a recall colonoscopy in 01/2019. We will send you a reminder in the mail when it gets closer to that time.  Thank you for choosing me and [facility].  Pricilla Riffle. Dagoberto Ligas., MD., Marval Regal

## 2018-03-13 ENCOUNTER — Other Ambulatory Visit (HOSPITAL_BASED_OUTPATIENT_CLINIC_OR_DEPARTMENT_OTHER): Payer: Self-pay | Admitting: Family Medicine

## 2018-03-13 ENCOUNTER — Ambulatory Visit (HOSPITAL_BASED_OUTPATIENT_CLINIC_OR_DEPARTMENT_OTHER)
Admission: RE | Admit: 2018-03-13 | Discharge: 2018-03-13 | Disposition: A | Discharge status: Home / Self Care | Payer: BLUE CROSS/BLUE SHIELD | Source: Ambulatory Visit | Attending: Family Medicine | Admitting: Family Medicine

## 2018-03-13 DIAGNOSIS — R911 Solitary pulmonary nodule: Secondary | ICD-10-CM

## 2018-10-10 ENCOUNTER — Telehealth: Payer: Self-pay | Admitting: Gastroenterology

## 2018-10-10 NOTE — Telephone Encounter (Signed)
Patient had the labs drawn as part of an insurance exam.  He will bring copies with him to his appt on 11/02/18.  Colon and pre-visit scheduled in the [facility].

## 2018-10-10 NOTE — Telephone Encounter (Signed)
Patient has a colonoscopy recall for 01/2019.  Should he have procedure earlier than December?

## 2018-10-10 NOTE — Telephone Encounter (Signed)
Pt reported that recent blood works reveal CEA level of 3.1 ng/mL.  Pt would like to know whether his recall should be moved up.

## 2018-10-10 NOTE — Telephone Encounter (Signed)
Please send a copy of all the blood work he had done recently and please schedule his colonoscopy within the next month or two.

## 2018-11-02 ENCOUNTER — Other Ambulatory Visit: Payer: Self-pay

## 2018-11-02 ENCOUNTER — Encounter: Payer: Self-pay | Admitting: Gastroenterology

## 2018-11-02 ENCOUNTER — Ambulatory Visit (AMBULATORY_SURGERY_CENTER): Payer: Self-pay

## 2018-11-02 VITALS — Ht 72.0 in | Wt 176.6 lb

## 2018-11-02 DIAGNOSIS — Z800 Family history of malignant neoplasm of digestive organs: Secondary | ICD-10-CM

## 2018-11-02 MED ORDER — NA SULFATE-K SULFATE-MG SULF 17.5-3.13-1.6 GM/177ML PO SOLN: 1.0000 | Freq: Once | ORAL | 0 refills | Status: AC

## 2018-11-02 NOTE — Progress Notes (Signed)
Denies allergies to eggs or soy products. Denies complication of anesthesia or sedation. Denies use of weight loss medication. Denies use of O2.   Emmi instructions given for colonoscopy.  A 15.00 coupon for Suprep was given to the patient.  

## 2018-11-14 ENCOUNTER — Telehealth: Payer: Self-pay

## 2018-11-14 NOTE — Telephone Encounter (Signed)
Covid-19 screening questions   Do you now or have you had a fever in the last 14 days?  Do you have any respiratory symptoms of shortness of breath or cough now or in the last 14 days?  Do you have any family members or close contacts with diagnosed or suspected Covid-19 in the past 14 days?  Have you been tested for Covid-19 and found to be positive?       

## 2018-11-14 NOTE — Telephone Encounter (Signed)
Pt answered  "NO" to all covid questions. °

## 2018-11-15 ENCOUNTER — Ambulatory Visit (AMBULATORY_SURGERY_CENTER): Payer: BC Managed Care – PPO | Admitting: Gastroenterology

## 2018-11-15 ENCOUNTER — Other Ambulatory Visit: Payer: Self-pay

## 2018-11-15 ENCOUNTER — Encounter: Payer: Self-pay | Admitting: Gastroenterology

## 2018-11-15 ENCOUNTER — Other Ambulatory Visit: Payer: Self-pay | Admitting: Gastroenterology

## 2018-11-15 VITALS — BP 107/55 | HR 78 | Temp 99.5°F | Resp 11 | Ht 72.0 in | Wt 176.0 lb

## 2018-11-15 DIAGNOSIS — D123 Benign neoplasm of transverse colon: Secondary | ICD-10-CM | POA: Diagnosis not present

## 2018-11-15 DIAGNOSIS — Z800 Family history of malignant neoplasm of digestive organs: Secondary | ICD-10-CM | POA: Diagnosis present

## 2018-11-15 DIAGNOSIS — Z1211 Encounter for screening for malignant neoplasm of colon: Secondary | ICD-10-CM

## 2018-11-15 MED ORDER — SODIUM CHLORIDE 0.9 % IV SOLN: 500.0000 mL | Freq: Once | INTRAVENOUS | Status: DC

## 2018-11-15 NOTE — Progress Notes (Signed)
Pt Drowsy. VSS. To PACU, report to RN. No anesthetic complications noted.  

## 2018-11-15 NOTE — Op Note (Signed)
[facility] Patient Name: Grant Cunningham Procedure Date: 11/15/2018 10:59 AM MRN: [id number] Endoscopist: Ladene Artist , MD Age: 58 Referring MD:  Date of Birth: November 07, 1960 Gender: Male Account #: [id number] Procedure:                Colonoscopy Indications:              Screening patient at increased risk: Family history                            of colorectal cancer in multiple 1st-degree                            relatives Medicines:                Monitored Anesthesia Care Procedure:                Pre-Anesthesia Assessment:                           - Prior to the procedure, a History and Physical                            was performed, and patient medications and                            allergies were reviewed. The patient's tolerance of                            previous anesthesia was also reviewed. The risks                            and benefits of the procedure and the sedation                            options and risks were discussed with the patient.                            All questions were answered, and informed consent                            was obtained. Prior Anticoagulants: The patient has                            taken no previous anticoagulant or antiplatelet                            agents. ASA Grade Assessment: II - A patient with                            mild systemic disease. After reviewing the risks                            and benefits, the patient was deemed in  satisfactory condition to undergo the procedure.                           After obtaining informed consent, the colonoscope                            was passed under direct vision. Throughout the                            procedure, the patient's blood pressure, pulse, and                            oxygen saturations were monitored continuously. The                            Colonoscope was introduced through the anus and                         advanced to the the cecum, identified by                            appendiceal orifice and ileocecal valve. The                            ileocecal valve, appendiceal orifice, and rectum                            were photographed. The quality of the bowel                            preparation was good. The colonoscopy was performed                            without difficulty. The patient tolerated the                            procedure well. Scope In: 11:04:19 AM Scope Out: 11:19:07 AM Scope Withdrawal Time: 0 hours 12 minutes 9 seconds  Total Procedure Duration: 0 hours 14 minutes 48 seconds  Findings:                 The perianal and digital rectal examinations were                            normal.                           A 6 mm polyp was found in the transverse colon. The                            polyp was sessile. The polyp was removed with a                            cold snare. Resection and retrieval were complete.  Multiple small-mouthed diverticula were found in                            the left colon. There was no evidence of                            diverticular bleeding.                           Internal hemorrhoids were found during                            retroflexion. The hemorrhoids were small and Grade                            I (internal hemorrhoids that do not prolapse).                           The exam was otherwise without abnormality on                            direct and retroflexion views. Complications:            No immediate complications. Estimated blood loss:                            None. Estimated Blood Loss:     Estimated blood loss: none. Impression:               - One 6 mm polyp in the transverse colon, removed                            with a cold snare. Resected and retrieved.                           - Mild diverticulosis in the left colon.                           -  Internal hemorrhoids.                           - The examination was otherwise normal on direct                            and retroflexion views. Recommendation:           - Repeat colonoscopy in 5 years for surveillance.                           - Patient has a contact number available for                            emergencies. The signs and symptoms of potential                            delayed complications were discussed with the  patient. Return to normal activities tomorrow.                            Written discharge instructions were provided to the                            patient.                           - Resume previous diet.                           - Continue present medications.                           - Await pathology results. Ladene Artist, MD 11/15/2018 11:23:38 AM This report has been signed electronically.

## 2018-11-15 NOTE — Progress Notes (Signed)
Called to room to assist during endoscopic procedure.  Patient ID and intended procedure confirmed with present staff. Received instructions for my participation in the procedure from the performing physician.  

## 2018-11-15 NOTE — Patient Instructions (Signed)
Impression/Recommendations:  Polyp handout given to patient. Diverticulosis handout given to patient. Hemorrhoid handout given to patient.  Repeat colonoscopy in 5 years for surveillance.  Resume previous diet. Continue present medications. Await pathology results.  YOU HAD AN ENDOSCOPIC PROCEDURE TODAY AT THE [facility]:   Refer to the procedure report that was given to you for any specific questions about what was found during the examination.  If the procedure report does not answer your questions, please call your gastroenterologist to clarify.  If you requested that your care partner not be given the details of your procedure findings, then the procedure report has been included in a sealed envelope for you to review at your convenience later.  YOU SHOULD EXPECT: Some feelings of bloating in the abdomen. Passage of more gas than usual.  Walking can help get rid of the air that was put into your GI tract during the procedure and reduce the bloating. If you had a lower endoscopy (such as a colonoscopy or flexible sigmoidoscopy) you may notice spotting of blood in your stool or on the toilet paper. If you underwent a bowel prep for your procedure, you may not have a normal bowel movement for a few days.  Please Note:  You might notice some irritation and congestion in your nose or some drainage.  This is from the oxygen used during your procedure.  There is no need for concern and it should clear up in a day or so.  SYMPTOMS TO REPORT IMMEDIATELY:   Following lower endoscopy (colonoscopy or flexible sigmoidoscopy):  Excessive amounts of blood in the stool  Significant tenderness or worsening of abdominal pains  Swelling of the abdomen that is new, acute  Fever of 100F or higher  For urgent or emergent issues, a gastroenterologist can be reached at any hour by calling [phone].   DIET:  We do recommend a small meal at first, but then you may proceed to your regular  diet.  Drink plenty of fluids but you should avoid alcoholic beverages for 24 hours.  ACTIVITY:  You should plan to take it easy for the rest of today and you should NOT DRIVE or use heavy machinery until tomorrow (because of the sedation medicines used during the test).    FOLLOW UP: Our staff will call the number listed on your records 48-72 hours following your procedure to check on you and address any questions or concerns that you may have regarding the information given to you following your procedure. If we do not reach you, we will leave a message.  We will attempt to reach you two times.  During this call, we will ask if you have developed any symptoms of COVID 19. If you develop any symptoms (ie: fever, flu-like symptoms, shortness of breath, cough etc.) before then, please call [phone].  If you test positive for Covid 19 in the 2 weeks post procedure, please call and report this information to us.    If any biopsies were taken you will be contacted by phone or by letter within the next 1-3 weeks.  Please call us at [phone] if you have not heard about the biopsies in 3 weeks.    SIGNATURES/CONFIDENTIALITY: You and/or your care partner have signed paperwork which will be entered into your electronic medical record.  These signatures attest to the fact that that the information above on your After Visit Summary has been reviewed and is understood.  Full responsibility of the confidentiality of this   discharge information lies with you and/or your care-partner. 

## 2018-11-19 ENCOUNTER — Telehealth: Payer: Self-pay

## 2018-11-19 NOTE — Telephone Encounter (Signed)
  Follow up Call-  Call back number 11/15/2018  Post procedure Call Back phone  # [phone]  Permission to leave phone message Yes  Some recent data might be hidden     Patient questions:  Do you have a fever, pain , or abdominal swelling? No. Pain Score  0 *  Have you tolerated food without any problems? Yes.    Have you been able to return to your normal activities? Yes.    Do you have any questions about your discharge instructions: Diet   No. Medications  No. Follow up visit  No.  Do you have questions or concerns about your Care? No.  Actions: * If pain score is 4 or above: 1. No action needed, pain <4.Have you developed a fever since your procedure? no  2.   Have you had an respiratory symptoms (SOB or cough) since your procedure? no  3.   Have you tested positive for COVID 19 since your procedure no  4.   Have you had any family members/close contacts diagnosed with the COVID 19 since your procedure?  no   If yes to any of these questions please route to Joylene John, RN and Alphonsa Gin, Therapist, sports.

## 2018-11-22 ENCOUNTER — Encounter: Payer: Self-pay | Admitting: Gastroenterology

## 2020-10-15 IMAGING — CR DG CHEST 2V
2 series · 2 of 2 positions shown · non-contrast
Comparison: One-view chest x-ray 07/26/2006

CLINICAL DATA: Intermittent morning cough for 6 months. Lung
nodule. Notes in [REDACTED] demonstrate a 5 6 mm nodule
identified on CT chest 5499 at [email or web address]
Report states it was stable for 5 years at that time and no further
follow-up is necessary.

EXAM:
CHEST - 2 VIEW

[w chest pa]
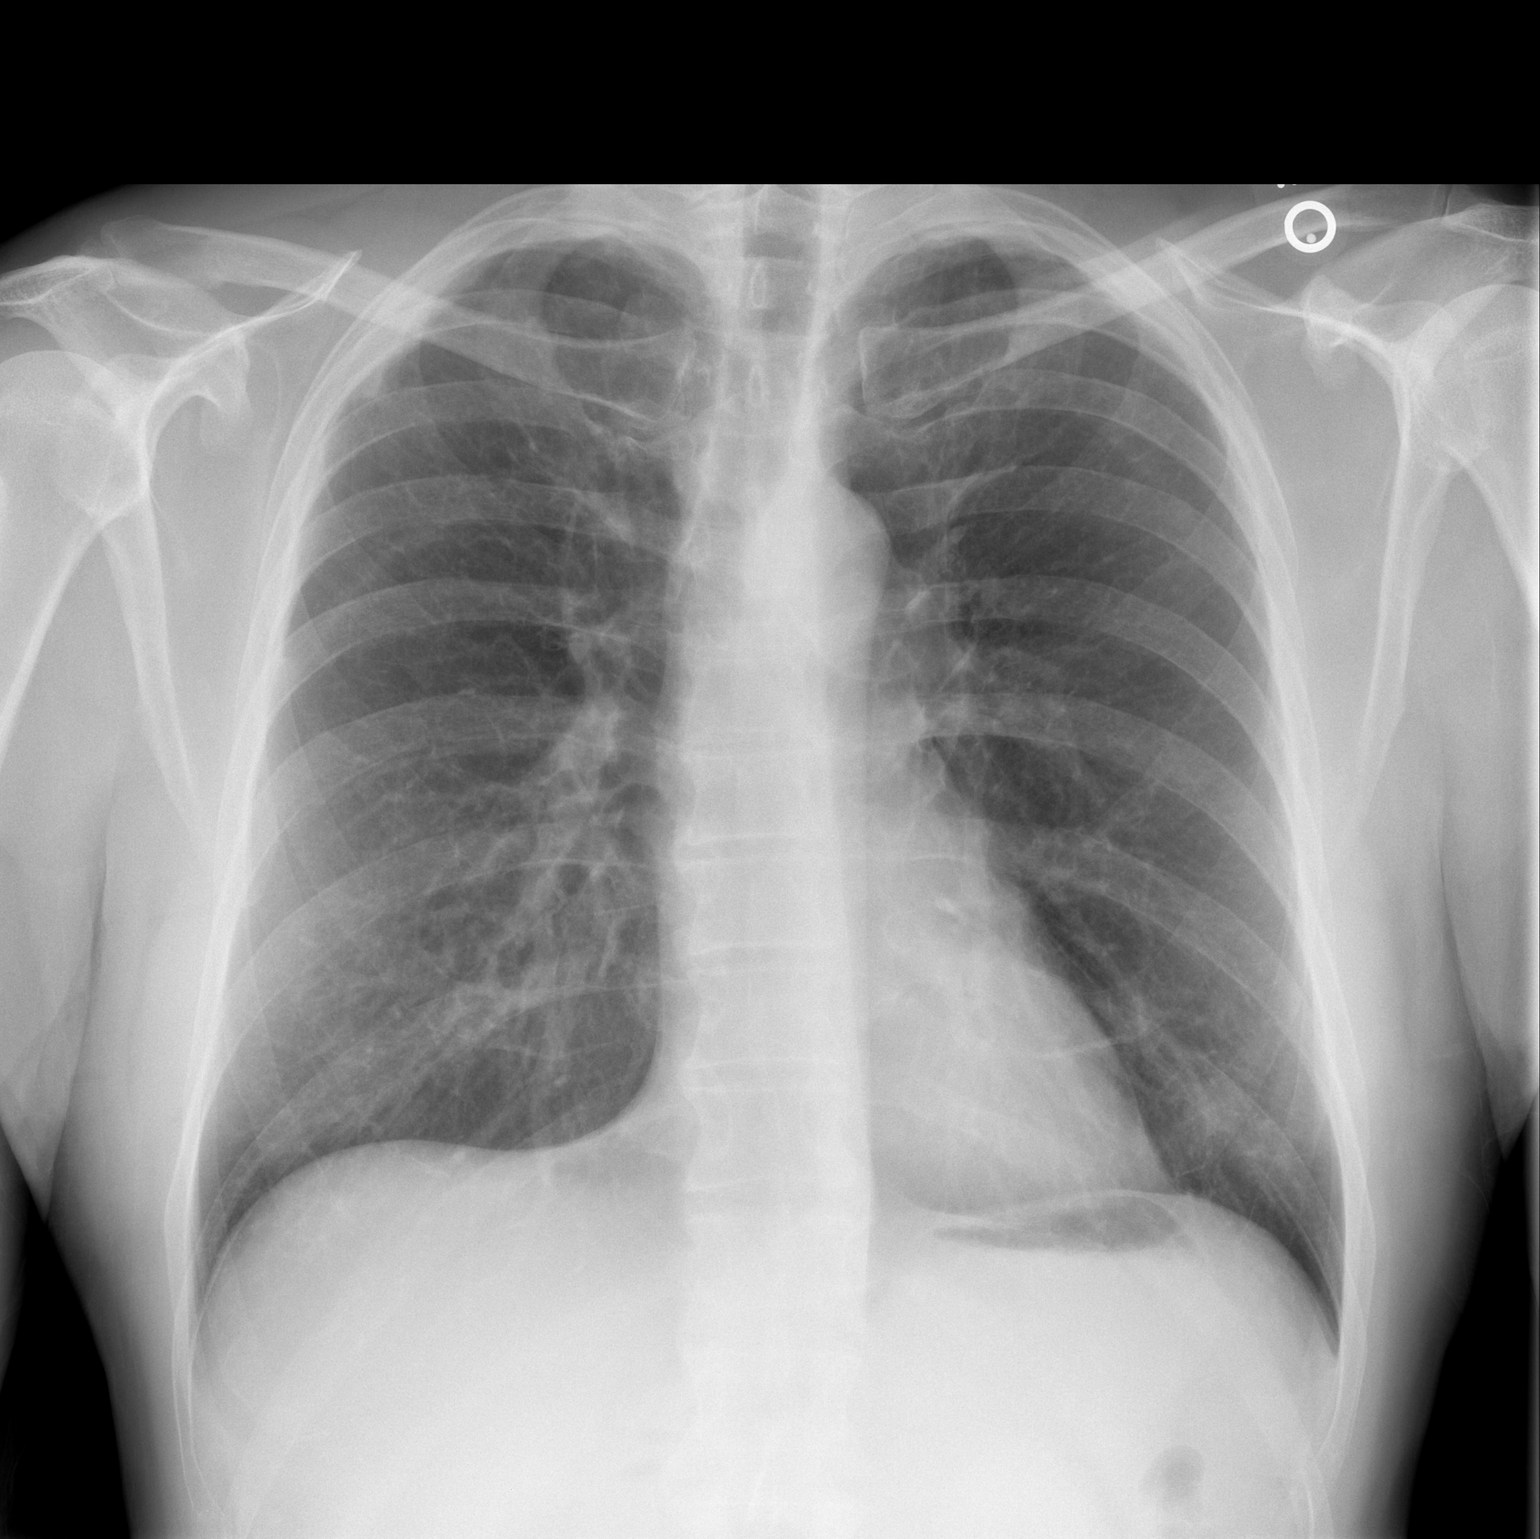

[w chest lat]
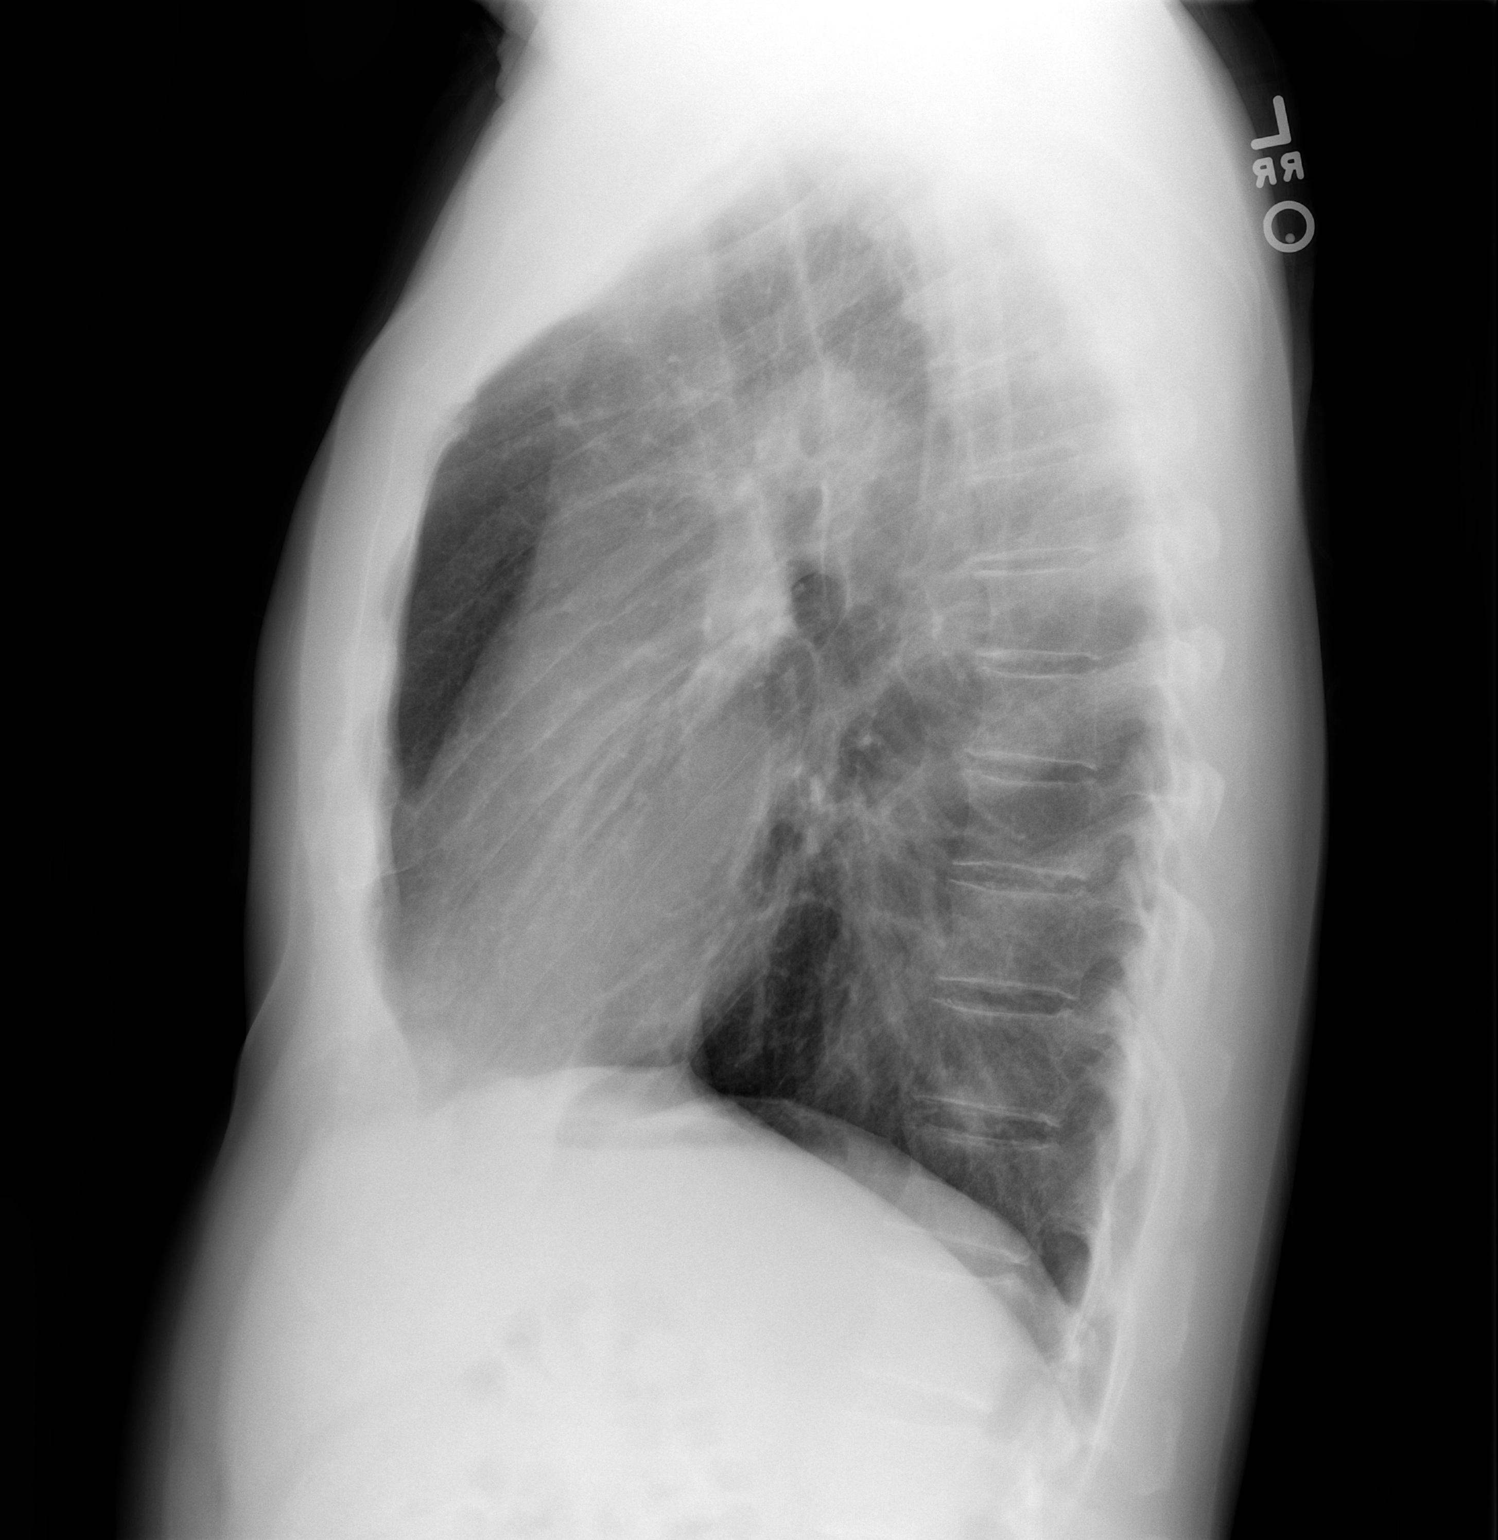

[2 of 2 positions shown; findings below may reference images not displayed]

FINDINGS: Heart size is normal. Lungs are hyperinflated. No edema or effusion
is present. No focal airspace disease is evident. There is some
scarring at the apices.
IMPRESSION: No active cardiopulmonary disease.

## 2021-10-27 ENCOUNTER — Emergency Department (HOSPITAL_BASED_OUTPATIENT_CLINIC_OR_DEPARTMENT_OTHER): Payer: BC Managed Care – PPO

## 2021-10-27 ENCOUNTER — Emergency Department (HOSPITAL_BASED_OUTPATIENT_CLINIC_OR_DEPARTMENT_OTHER)
Admission: EM | Admit: 2021-10-27 | Discharge: 2021-10-27 | Disposition: A | Discharge status: Home / Self Care | Payer: BC Managed Care – PPO | Attending: Emergency Medicine | Admitting: Emergency Medicine

## 2021-10-27 ENCOUNTER — Encounter (HOSPITAL_BASED_OUTPATIENT_CLINIC_OR_DEPARTMENT_OTHER): Payer: Self-pay | Admitting: Emergency Medicine

## 2021-10-27 ENCOUNTER — Other Ambulatory Visit: Payer: Self-pay

## 2021-10-27 DIAGNOSIS — R0609 Other forms of dyspnea: Secondary | ICD-10-CM | POA: Diagnosis not present

## 2021-10-27 DIAGNOSIS — R0602 Shortness of breath: Secondary | ICD-10-CM | POA: Diagnosis present

## 2021-10-27 DIAGNOSIS — Z20822 Contact with and (suspected) exposure to covid-19: Secondary | ICD-10-CM | POA: Diagnosis not present

## 2021-10-27 LAB — URINALYSIS, ROUTINE W REFLEX MICROSCOPIC
Bilirubin Urine: NEGATIVE
Glucose, UA: NEGATIVE mg/dL
Ketones, ur: NEGATIVE mg/dL
Leukocytes,Ua: NEGATIVE
Nitrite: NEGATIVE
Protein, ur: NEGATIVE mg/dL
Specific Gravity, Urine: 1.01 (ref 1.005–1.030)
pH: 6.5 (ref 5.0–8.0)

## 2021-10-27 LAB — CBC WITH DIFFERENTIAL/PLATELET
Abs Immature Granulocytes: 0.02 10*3/uL (ref 0.00–0.07)
Basophils Absolute: 0 10*3/uL (ref 0.0–0.1)
Basophils Relative: 1 %
Eosinophils Absolute: 0 10*3/uL (ref 0.0–0.5)
Eosinophils Relative: 1 %
HCT: 44 % (ref 39.0–52.0)
Hemoglobin: 15.6 g/dL (ref 13.0–17.0)
Immature Granulocytes: 1 %
Lymphocytes Relative: 21 %
Lymphs Abs: 0.9 10*3/uL (ref 0.7–4.0)
MCH: 31 pg (ref 26.0–34.0)
MCHC: 35.5 g/dL (ref 30.0–36.0)
MCV: 87.3 fL (ref 80.0–100.0)
Monocytes Absolute: 0.7 10*3/uL (ref 0.1–1.0)
Monocytes Relative: 16 %
Neutro Abs: 2.5 10*3/uL (ref 1.7–7.7)
Neutrophils Relative %: 60 %
Platelets: 131 10*3/uL — ABNORMAL LOW (ref 150–400)
RBC: 5.04 MIL/uL (ref 4.22–5.81)
RDW: 12.4 % (ref 11.5–15.5)
WBC: 4.2 10*3/uL (ref 4.0–10.5)
nRBC: 0 % (ref 0.0–0.2)

## 2021-10-27 LAB — BASIC METABOLIC PANEL
Anion gap: 5 (ref 5–15)
BUN: 15 mg/dL (ref 6–20)
CO2: 27 mmol/L (ref 22–32)
Calcium: 8.8 mg/dL — ABNORMAL LOW (ref 8.9–10.3)
Chloride: 105 mmol/L (ref 98–111)
Creatinine, Ser: 1.16 mg/dL (ref 0.61–1.24)
GFR, Estimated: >60 mL/min (ref >60)
Glucose, Bld: 114 mg/dL — ABNORMAL HIGH (ref 70–99)
Potassium: 3.7 mmol/L (ref 3.5–5.1)
Sodium: 137 mmol/L (ref 135–145)

## 2021-10-27 LAB — RESP PANEL BY RT-PCR (FLU A&B, COVID) ARPGX2
Influenza A by PCR: NEGATIVE
Influenza B by PCR: NEGATIVE
SARS Coronavirus 2 by RT PCR: NEGATIVE

## 2021-10-27 LAB — URINALYSIS, MICROSCOPIC (REFLEX): WBC, UA: NONE SEEN WBC/hpf (ref 0–5)

## 2021-10-27 LAB — TROPONIN I (HIGH SENSITIVITY)
Troponin I (High Sensitivity): 3 ng/L (ref <18)
Troponin I (High Sensitivity): 4 ng/L (ref <18)

## 2021-10-27 LAB — BRAIN NATRIURETIC PEPTIDE: B Natriuretic Peptide: 15 pg/mL (ref 0.0–100.0)

## 2021-10-27 LAB — D-DIMER, QUANTITATIVE: D-Dimer, Quant: 0.39 ug/mL-FEU (ref 0.00–0.50)

## 2021-10-27 MED ORDER — ALBUTEROL SULFATE HFA 108 (90 BASE) MCG/ACT IN AERS: 2.0000 | INHALATION_SPRAY | RESPIRATORY_TRACT | Status: DC | PRN

## 2021-10-27 NOTE — ED Provider Notes (Signed)
[facility] EMERGENCY DEPARTMENT Provider Note   CSN: [id number] Arrival date & time: 10/27/21  1618     History  Chief Complaint  Patient presents with   Shortness of Breath    Grant Cunningham is a 61 y.o. male. With past medical history of diverticulosis who presents to the emergency department for shortness of breath.   States since Monday he has felt short of breath.  States that all 3 days he has been exerting himself either in the yard or going upstairs when he becomes acutely short of breath.  He states at that time he also feels like his heart is racing and then this improves with rest.  He denies any chest pain.  He denies feeling lightheaded or dizzy.  He has no associated nausea or diaphoresis with these episodes.  He states that intermittently over the past 6 months he has felt short of breath but never with exertion.  He states most recently 2 weeks ago he bailed over 50 bales of hay.  States that he was not short of breath at that time.  Denies cough or fever.  He denies having orthopnea or PND.  Denies any lower extremity edema.  Denies any recent long flights or drives, tobacco use, history of cancer.  On chart review, only have coronary calcium score of zero on CT in 2015.   HPI     Home Medications Prior to Admission medications   Medication Sig Start Date End Date Taking? Authorizing Provider  CALCIUM PO Take 1 tablet by mouth daily.    [provider]  cholecalciferol (VITAMIN D) 400 UNITS TABS tablet Take 400 Units by mouth daily.    [provider]  Cyanocobalamin (VITAMIN B 12 PO) Take 1 tablet by mouth daily.    [provider]  Testosterone 10 MG/ACT (2%) GEL Place onto the skin.    [provider]      Allergies    Patient has no known allergies.    Review of Systems   Review of Systems  Constitutional:  Negative for diaphoresis and fever.  Respiratory:  Positive for shortness of breath. Negative for cough  and chest tightness.   Cardiovascular:  Positive for palpitations. Negative for chest pain and leg swelling.  Gastrointestinal:  Negative for nausea.  Neurological:  Negative for dizziness, syncope and light-headedness.  All other systems reviewed and are negative.   Physical Exam Updated Vital Signs BP 120/62 (BP Location: Right Arm)   Pulse 64   Temp 98 F (36.7 C) (Oral)   Resp 16   Ht 6' (1.829 m)   Wt 81.6 kg   SpO2 100%   BMI 24.41 kg/m  Physical Exam Vitals and nursing note reviewed.  Constitutional:      General: He is not in acute distress.    Appearance: Normal appearance. He is well-developed. He is not ill-appearing or toxic-appearing.  HENT:     Head: Normocephalic and atraumatic.     Mouth/Throat:     Mouth: Mucous membranes are moist.     Pharynx: Oropharynx is clear.  Eyes:     General: No scleral icterus.    Extraocular Movements: Extraocular movements intact.     Pupils: Pupils are equal, round, and reactive to light.  Neck:     Vascular: No JVD.  Cardiovascular:     Rate and Rhythm: Normal rate and regular rhythm.     Pulses: Normal pulses.     Heart sounds: No murmur  heard. Pulmonary:     Effort: Pulmonary effort is normal. No tachypnea or accessory muscle usage.     Breath sounds: Normal breath sounds. No wheezing, rhonchi or rales.  Chest:     Chest wall: No tenderness.  Abdominal:     General: Bowel sounds are normal.     Palpations: Abdomen is soft.  Musculoskeletal:     Cervical back: Normal range of motion.     Right lower leg: No edema.     Left lower leg: No edema.  Skin:    General: Skin is warm and dry.     Capillary Refill: Capillary refill takes less than 2 seconds.  Neurological:     General: No focal deficit present.     Mental Status: He is alert and oriented to person, place, and time.  Psychiatric:        Mood and Affect: Mood normal.        Behavior: Behavior normal.     ED Results / Procedures / Treatments    Labs (all labs ordered are listed, but only abnormal results are displayed) Labs Reviewed  BASIC METABOLIC PANEL - Abnormal; Notable for the following components:      Result Value   Glucose, Bld 114 (*)    Calcium 8.8 (*)    All other components within normal limits  CBC WITH DIFFERENTIAL/PLATELET - Abnormal; Notable for the following components:   Platelets 131 (*)    All other components within normal limits  URINALYSIS, ROUTINE W REFLEX MICROSCOPIC - Abnormal; Notable for the following components:   Hgb urine dipstick TRACE (*)    All other components within normal limits  URINALYSIS, MICROSCOPIC (REFLEX) - Abnormal; Notable for the following components:   Bacteria, UA RARE (*)    All other components within normal limits  RESP PANEL BY RT-PCR (FLU A&B, COVID) ARPGX2  BRAIN NATRIURETIC PEPTIDE  D-DIMER, QUANTITATIVE  TROPONIN I (HIGH SENSITIVITY)  TROPONIN I (HIGH SENSITIVITY)   EKG EKG Interpretation  Date/Time:  Wednesday October 27 2021 16:29:08 EDT Ventricular Rate:  88 PR Interval:  158 QRS Duration: 108 QT Interval:  350 QTC Calculation: 424 R Axis:   52 Text Interpretation: Sinus rhythm Low voltage, precordial leads No significant change since last tracing Confirmed by Deno Etienne [phone]) on 10/27/2021 4:32:23 PM  Radiology DG Chest 2 View  Result Date: 10/27/2021 CLINICAL DATA:  Shortness of breath, cough EXAM: CHEST - 2 VIEW COMPARISON:  03/13/2018 FINDINGS: Cardiac size is within normal limits. Lung fields are clear of any infiltrates or pulmonary edema. Increase in the AP diameter of chest may be a normal variation due to patient's body habitus or suggest COPD. There is no pleural effusion or pneumothorax. IMPRESSION: No active cardiopulmonary disease. Electronically Signed   By: Elmer Picker M.D.   On: 10/27/2021 16:45    Procedures Procedures   Medications Ordered in ED Medications  albuterol (VENTOLIN HFA) 108 (90 Base) MCG/ACT inhaler 2 puff (has  no administration in time range)    ED Course/ Medical Decision Making/ A&P           HEART Score: 3                Medical Decision Making Amount and/or Complexity of Data Reviewed Labs: ordered. Radiology: ordered.  Risk Prescription drug management.  This patient presents to the ED with chief complaint(s) of shortness of breath with pertinent past medical history of diverticulosis which further complicates the presenting complaint. The complaint involves  an extensive differential diagnosis and treatment options and also carries with it a high risk of complications and morbidity.    The differential diagnosis includes Acute chest syndrome, stable angina, atypical angina, pulmonary embolism, pneumothorax, aortic dissection, pleural effusion, CHF, COPD, asthma, myocarditis, pericarditis, cardiac tamponade, chest wall pain    Additional history obtained: Additional history obtained from  none available  Records reviewed Care Everywhere/External Records  ED Course: Lab Tests: I Ordered, and personally interpreted labs.  The pertinent results include:   BMP  essentially within normal limits CBC normal limits, mildly decreased platelets to 131 BNP 15, negative Troponin and 2 negative D-dimer negative COVID and flu negative Imaging Studies: I ordered and independently visualized and interpreted the following imaging X-ray chest   which showed no acute findings The interpretation of the imaging was limited to assessing for emergent pathology, for which purpose it was ordered. Cardiac Monitoring: The patient was maintained on a cardiac monitor.  I personally viewed and interpreted the cardiac monitor which showed an underlying rhythm of:  sinus rhythm Medicines ordered and prescription drug management: I ordered the following medications none  I considered this additional medications: N/A Reevaluation of the patient after these medicines showed that the patient      N/A  Reassessment and review: 61 year old male who presents to the emergency department with shortness of breath on exertion. His physical exam is unremarkable. His work-up here has been negative. EKG without ischemia or infarction, troponin x2 negative doubt ACS.  He does not appear to be fluid volume overloaded on physical exam and BNP is negative so doubt heart failure. His symptoms are inconsistent with a dissection, myocarditis, pericarditis. Chest x-ray without pneumonia, pneumothorax or pleural effusion. COVID and flu negative D-dimer negative, doubt acute PE.  Will not pursue CTA PE study.  He is Wells low risk. HEART Score: 3 Although his heart score is 3 and he is low risk, feel that he likely needs cardiology follow-up.  His symptoms are suspicious for possible cardiac etiology.  I called and spoke with Dr. Ali Lowe, with cardiology who recommends outpatient follow-up.  I have placed a ambulatory referral to cardiology.  Given him follow-up instructions for this.  I discussed the results at the bedside with him and answered all of his questions.  He verbalized understanding.  Otherwise I feel that he is safe for discharge at this time.  Do not think that there are any emergent findings requiring further work-up at this time. Consultations Obtained: I requested consultation with the consultant cardiology, Dr. Ali Lowe , and discussed  findings as well as pertinent plan - they recommend: Outpatient follow-up  Complexity of problems addressed: Patient's presentation is most consistent with  acute presentation with potential threat to life or bodily function During patient's assessment  Disposition: After consideration of the diagnostic results and the patient's response to treatment,  I feel that the patent would benefit from discharge home with outpatient cardiology follow-up .  Social Determinants of Health: Patient's  none identified   increases the complexity of managing their  presentation  Final Clinical Impression(s) / ED Diagnoses Final diagnoses:  Dyspnea on exertion    Rx / DC Orders ED Discharge Orders          Ordered    Ambulatory referral to Cardiology       Comments: If you have not heard from the Cardiology office within the next 72 hours please call [phone].   10/27/21 1918  Mickie Hillier, PA-C 10/27/21 2147    Deno Etienne, DO 10/27/21 2327

## 2021-10-27 NOTE — Discharge Instructions (Signed)
You were seen in the emergency department today for shortness of breath while exerting yourself.  Your work-up here has been reassuring but we do feel that it is important for you to follow-up with a cardiologist.  I have sent a referral to cardiology. If you have not heard from the Cardiology office within the next 72 hours please call [phone].  Please return to the emergency department for worsening symptoms such as chest pain with nausea or sweating or the chest pain radiates to your back, arm or jaw.  If your shortness of breath becomes worse also please present to the emergency department.

## 2021-10-27 NOTE — ED Triage Notes (Signed)
Pt reports shortness of breath 3 days ago , going up the stairs . Dull headache 2 days cough. Denies cough or congestion .

## 2021-11-25 ENCOUNTER — Encounter: Payer: Self-pay | Admitting: Cardiovascular Disease

## 2021-11-25 ENCOUNTER — Ambulatory Visit: Payer: BC Managed Care – PPO | Attending: Cardiovascular Disease | Admitting: Cardiovascular Disease

## 2021-11-25 VITALS — BP 116/68 | HR 89 | Ht 72.0 in | Wt 178.2 lb

## 2021-11-25 DIAGNOSIS — R0602 Shortness of breath: Secondary | ICD-10-CM | POA: Diagnosis not present

## 2021-11-25 DIAGNOSIS — Z8249 Family history of ischemic heart disease and other diseases of the circulatory system: Secondary | ICD-10-CM | POA: Diagnosis not present

## 2021-11-25 NOTE — Patient Instructions (Signed)
Medication Instructions:  No changes *If you need a refill on your cardiac medications before your next appointment, please call your pharmacy*   Lab Work: None ordered If you have labs (blood work) drawn today and your tests are completely normal, you will receive your results only by: [place] (if you have MyChart) OR A paper copy in the mail If you have any lab test that is abnormal or we need to change your treatment, we will call you to review the results.   Testing/Procedures: Your physician has requested that you have an exercise tolerance test. For further information please visit [email or web address]. Please also follow instruction sheet, as given. This will take place at [place], [address]. Do not drink or eat foods with caffeine for 24 hours before the test. (Chocolate, coffee, tea, or energy drinks) If you use an inhaler, bring it with you to the test. Do not smoke for 4 hours before the test. Wear comfortable shoes and clothing.  Your physician has requested that you have an echocardiogram. Echocardiography is a painless test that uses sound waves to create images of your heart. It provides your doctor with information about the size and shape of your heart and how well your heart's chambers and valves are working. You may receive an ultrasound enhancing agent through an IV if needed to better visualize your heart during the echo.This procedure takes approximately one hour. There are no restrictions for this procedure. This will take place at the [address].    Follow-Up: At [facility], you and your health needs are our priority.  As part of our continuing mission to provide you with exceptional heart care, we have created designated Provider Care Teams.  These Care Teams include your primary Cardiologist (physician) and Advanced Practice Providers (APPs -  Physician Assistants and Nurse Practitioners) who all work together to provide  you with the care you need, when you need it.  We recommend signing up for the patient portal called "MyChart".  Sign up information is provided on this After Visit Summary.  MyChart is used to connect with patients for Virtual Visits (Telemedicine).  Patients are able to view lab/test results, encounter notes, upcoming appointments, etc.  Non-urgent messages can be sent to your provider as well.   To learn more about what you can do with MyChart, go to [email or web address].    Your next appointment:   Follow up as needed with Dr. Sallyanne Kuster  Important Information About Sugar

## 2021-11-25 NOTE — Progress Notes (Signed)
Cardiology Office Note:    Date:  11/25/2021   ID:  Grant Cunningham, DOB 09-06-1960, MRN [id number]  PCP:  Zane Herald, MD   [facility] Providers Cardiologist:  None     Referring MD: Mickie Hillier, PA-C   No chief complaint on file. Grant Cunningham is a 61 y.o. male who is being seen today for the evaluation of shortness of breath at the request of Mickie Hillier, PA-C.   History of Present Illness:    Grant Cunningham is a 61 y.o. male with a hx of excellent personal health, with a strong family history of premature onset CAD (father had triple bypass in his mid 58s, multiple paternal uncles also had CAD).  For 3 days last month he noticed significant dyspnea with activity.  He is usually very active, works on a horse farm 3 days a week.  Accustomed to pitching and baling hay on a regular basis without any difficulty.  On 08/28 - 08/30-2023 he had dyspnea simply climbing a short incline or after shoveling for just a minute or 2.  He did not have any chest pressure or tightness.  He did not experience palpitations, dizziness or syncope.  He did not have any dyspnea at rest, orthopnea PND or lower extremity edema.    He was evaluated in the emergency room on 10/27/2021.Marland Kitchen  His electrocardiogram was normal.  Vital signs and physical exam were normal.  Routine labs including 2 high-sensitivity troponin and BNP level were all normal.  D-dimer was negative.  COVID and flu screens were negative.  Chest x-ray was unremarkable.  He saw Dr. Mauricio Po with [facility] in 2015 for PVCs.  He had a normal ECG treadmill stress test 12 years ago and he had a stress echocardiogram in 2015 that was presumably normal (I cannot find the actual report).  In 2015 he had a low risk coronary calcium score (score of 0).  He reports a good metabolic profile and recalls that his recent hemoglobin A1c was 5.6%.  His cholesterol profile was "good".  Past Medical History:  Diagnosis Date    Diverticulosis    Low testosterone     Past Surgical History:  Procedure Laterality Date   COLONOSCOPY     LUMBAR LAMINECTOMY  1999, 2005   L5, S1   WISDOM TOOTH EXTRACTION      Current Medications: Current Meds  Medication Sig   CALCIUM PO Take 1 tablet by mouth daily.   cholecalciferol (VITAMIN D) 400 UNITS TABS tablet Take 400 Units by mouth daily.   Cyanocobalamin (VITAMIN B 12 PO) Take 1 tablet by mouth daily.   NP THYROID 30 MG tablet Take 30 mg by mouth daily.   Testosterone 10 MG/ACT (2%) GEL Place onto the skin.     Allergies:   Patient has no known allergies.   Social History   Socioeconomic History   Marital status: Divorced    Spouse name: Not on file   Number of children: 0   Years of education: Not on file   Highest education level: Not on file  Occupational History   Occupation: Merchant navy officer  Tobacco Use   Smoking status: Never   Smokeless tobacco: Never  Substance and Sexual Activity   Alcohol use: No    Alcohol/week: 0.0 standard drinks of alcohol    Comment: rare- once every few months   Drug use: No   Sexual activity: Not on file  Other Topics Concern  Not on file  Social History Narrative   Not on file   Social Determinants of Health   Financial Resource Strain: Not on file  Food Insecurity: Not on file  Transportation Needs: Not on file  Physical Activity: Not on file  Stress: Not on file  Social Connections: Not on file     Family History: The patient's family history includes Colon cancer (age of onset: 27) in his brother; Colon cancer (age of onset: 46) in his mother; Colon polyps in his brother; Diabetes in his paternal uncle; Heart disease in his father and paternal uncle; Lung cancer in his father. There is no history of Esophageal cancer, Rectal cancer, or Stomach cancer.  ROS:   Please see the history of present illness.     All other systems reviewed and are negative.  EKGs/Labs/Other Studies Reviewed:    The following  studies were reviewed today: Calcium score 2015 Notes from Dr. Mauricio Po 2015 Chest x-ray, labs, ECG from ER visit 10/27/2021  EKG:  EKG is  ordered today.  The ekg ordered today demonstrates normal sinus rhythm, completely normal tracing.  Recent Labs: 10/27/2021: B Natriuretic Peptide 15.0; BUN 15; Creatinine, Ser 1.16; Hemoglobin 15.6; Platelets 131; Potassium 3.7; Sodium 137  Recent Lipid Panel No results found for: "CHOL", "TRIG", "HDL", "CHOLHDL", "VLDL", "LDLCALC", "LDLDIRECT"   Risk Assessment/Calculations:                Physical Exam:    VS:  BP 116/68 (BP Location: Left Arm, Patient Position: Sitting, Cuff Size: Normal)   Pulse 89   Ht 6' (1.829 m)   Wt 178 lb 3.2 oz (80.8 kg)   SpO2 97%   BMI 24.17 kg/m     Wt Readings from Last 3 Encounters:  11/25/21 178 lb 3.2 oz (80.8 kg)  10/27/21 180 lb (81.6 kg)  11/15/18 176 lb (79.8 kg)     GEN: He appears healthy and fit.  Well nourished, well developed in no acute distress HEENT: Normal NECK: No JVD; No carotid bruits LYMPHATICS: No lymphadenopathy CARDIAC: RRR, no murmurs, rubs, gallops RESPIRATORY:  Clear to auscultation without rales, wheezing or rhonchi  ABDOMEN: Soft, non-tender, non-distended MUSCULOSKELETAL:  No edema; No deformity  SKIN: Warm and dry NEUROLOGIC:  Alert and oriented x 3 PSYCHIATRIC:  Normal affect   ASSESSMENT:    1. Shortness of breath   2. Family history of coronary artery disease    PLAN:    In order of problems listed above:  Shortness of breath: This was transitory and unexplained.  It was not associated with chest pain.  Work-up in the ER was unrevealing.  We will schedule him for plain treadmill stress test and an echocardiogram. Family history of early onset CAD: He will get me a copy of his lipid profile.  If his echo shows low EF or regional wall motion abnormalities or if he has ST segment depression on treadmill stress testing he should have a heart catheterization.   Otherwise we will probably get a coronary CT angiogram.  He appears quite concerned about his potential for developing CAD and early death.  He needs reassurance.      Shared Decision Making/Informed Consent The risks [chest pain, shortness of breath, cardiac arrhythmias, dizziness, blood pressure fluctuations, myocardial infarction, stroke/transient ischemic attack, and life-threatening complications (estimated to be 1 in 10,000)], benefits (risk stratification, diagnosing coronary artery disease, treatment guidance) and alternatives of an exercise tolerance test were discussed in detail with Mr. Hosier and he  agrees to proceed.    Medication Adjustments/Labs and Tests Ordered: Current medicines are reviewed at length with the patient today.  Concerns regarding medicines are outlined above.  Orders Placed This Encounter  Procedures   Cardiac Stress Test: Informed Consent Details: Physician/Practitioner Attestation; Transcribe to consent form and obtain patient signature   Exercise Tolerance Test   EKG 12-Lead   ECHOCARDIOGRAM COMPLETE   No orders of the defined types were placed in this encounter.   Patient Instructions  Medication Instructions:  No changes *If you need a refill on your cardiac medications before your next appointment, please call your pharmacy*   Lab Work: None ordered If you have labs (blood work) drawn today and your tests are completely normal, you will receive your results only by: [place] (if you have MyChart) OR A paper copy in the mail If you have any lab test that is abnormal or we need to change your treatment, we will call you to review the results.   Testing/Procedures: Your physician has requested that you have an exercise tolerance test. For further information please visit [email or web address]. Please also follow instruction sheet, as given. This will take place at [place], [address]. Do not drink or eat foods with caffeine for 24  hours before the test. (Chocolate, coffee, tea, or energy drinks) If you use an inhaler, bring it with you to the test. Do not smoke for 4 hours before the test. Wear comfortable shoes and clothing.  Your physician has requested that you have an echocardiogram. Echocardiography is a painless test that uses sound waves to create images of your heart. It provides your doctor with information about the size and shape of your heart and how well your heart's chambers and valves are working. You may receive an ultrasound enhancing agent through an IV if needed to better visualize your heart during the echo.This procedure takes approximately one hour. There are no restrictions for this procedure. This will take place at the [address], [address].    Follow-Up: At [facility], you and your health needs are our priority.  As part of our continuing mission to provide you with exceptional heart care, we have created designated Provider Care Teams.  These Care Teams include your primary Cardiologist (physician) and Advanced Practice Providers (APPs -  Physician Assistants and Nurse Practitioners) who all work together to provide you with the care you need, when you need it.  We recommend signing up for the patient portal called "MyChart".  Sign up information is provided on this After Visit Summary.  MyChart is used to connect with patients for Virtual Visits (Telemedicine).  Patients are able to view lab/test results, encounter notes, upcoming appointments, etc.  Non-urgent messages can be sent to your provider as well.   To learn more about what you can do with MyChart, go to [email or web address].    Your next appointment:   Follow up as needed with Dr. Sallyanne Kuster  Important Information About Sugar         Signed, Sanda Klein, MD  11/25/2021 5:35 PM    [place]

## 2021-12-13 ENCOUNTER — Ambulatory Visit (HOSPITAL_COMMUNITY): Payer: BC Managed Care – PPO | Attending: Cardiovascular Disease

## 2021-12-13 DIAGNOSIS — R0602 Shortness of breath: Secondary | ICD-10-CM | POA: Insufficient documentation

## 2021-12-13 LAB — ECHOCARDIOGRAM COMPLETE
Area-P 1/2: 3.34 cm2
S' Lateral: 2.6 cm

## 2021-12-21 ENCOUNTER — Telehealth (HOSPITAL_COMMUNITY): Payer: Self-pay | Admitting: *Deleted

## 2021-12-21 NOTE — Telephone Encounter (Signed)
Close encounter 

## 2021-12-22 ENCOUNTER — Ambulatory Visit (HOSPITAL_COMMUNITY)
Admission: RE | Admit: 2021-12-22 | Discharge: 2021-12-22 | Disposition: A | Discharge status: Home / Self Care | Payer: BC Managed Care – PPO | Source: Ambulatory Visit | Attending: Cardiology | Admitting: Cardiology

## 2021-12-22 DIAGNOSIS — R0602 Shortness of breath: Secondary | ICD-10-CM | POA: Insufficient documentation

## 2021-12-22 LAB — EXERCISE TOLERANCE TEST
Estimated workload: 15.3
Exercise duration (min): 13 min
Exercise duration (sec): 0 s
MPHR: 161 {beats}/min
Peak HR: 173 {beats}/min
Percent HR: 108 %
Rest HR: 75 {beats}/min
ST Depression (mm): 0 mm

## 2024-01-01 ENCOUNTER — Encounter: Payer: Self-pay | Admitting: Pediatrics

## 2024-01-22 ENCOUNTER — Ambulatory Visit (AMBULATORY_SURGERY_CENTER)

## 2024-01-22 VITALS — Ht 72.0 in | Wt 188.2 lb

## 2024-01-22 DIAGNOSIS — Z860100 Personal history of colon polyps, unspecified: Secondary | ICD-10-CM

## 2024-01-22 DIAGNOSIS — Z800 Family history of malignant neoplasm of digestive organs: Secondary | ICD-10-CM

## 2024-01-22 MED ORDER — NA SULFATE-K SULFATE-MG SULF 17.5-3.13-1.6 GM/177ML PO SOLN: 1.0000 | Freq: Once | ORAL | 0 refills | Status: AC

## 2024-01-22 NOTE — Progress Notes (Signed)

## 2024-01-30 ENCOUNTER — Encounter: Payer: Self-pay | Admitting: Pediatrics

## 2024-01-31 ENCOUNTER — Telehealth: Payer: Self-pay | Admitting: Pediatrics

## 2024-01-31 NOTE — Telephone Encounter (Signed)
 PT needs to speak about the cost of this procedure. Please advise.

## 2024-01-31 NOTE — Progress Notes (Signed)
 [facility] History and Physical   Primary Care Physician:  Teresia Wolm BRAVO, MD   Reason for Procedure:  History of adenomatous colon polyp, family history of colorectal cancer in multiple first-degree relatives  Plan:    Colonoscopy   The patient was provided an opportunity to ask questions and all were answered. The patient agreed with the plan.   HPI: Grant Cunningham is a 63 y.o. male undergoing colonoscopy to follow-up a history of adenomatous colon polyp as well as a family history of colorectal cancer in multiple first-degree relatives.  Patient has had 2 lifetime colonoscopies: Colonoscopy 2015-diverticuli, otherwise normal Colonoscopy 2020 -one 6 mm tubular adenoma, hemorrhoids and diverticula  Family history of colorectal cancer in the patient's mother and brother.  Brother has had a history of colon polyps.  Patient denies current symptoms of rectal bleeding or change in bowel habits.   Past Medical History:  Diagnosis Date   Diverticulosis    Low testosterone    Thyroid disease     Past Surgical History:  Procedure Laterality Date   COLONOSCOPY     LUMBAR LAMINECTOMY  1999, 2005   L5, S1   WISDOM TOOTH EXTRACTION      Prior to Admission medications   Medication Sig Start Date End Date Taking? Authorizing Provider  NP THYROID 30 MG tablet Take 30 mg by mouth daily. 08/21/21   [provider]  Testosterone 10 MG/ACT (2%) GEL Place onto the skin.    [provider]    Current Outpatient Medications  Medication Sig Dispense Refill   NP THYROID 30 MG tablet Take 30 mg by mouth daily.     Testosterone 10 MG/ACT (2%) GEL Place onto the skin.     vitamin B-12 (CYANOCOBALAMIN) 100 MCG tablet Take 100 mcg by mouth daily.     Current Facility-Administered Medications  Medication Dose Route Frequency Provider Last Rate Last Admin   0.9 %  sodium chloride  infusion  500 mL Intravenous Continuous Isidoro Santillana, Inocente HERO, MD        Allergies  as of 02/02/2024   (No Known Allergies)    Family History  Problem Relation Age of Onset   Colon cancer Mother 17   Lung cancer Father    Heart disease Father    Colon cancer Brother 34   Colon polyps Brother    Diabetes Paternal Uncle    Heart disease Paternal Uncle        x 4   Esophageal cancer Neg Hx    Rectal cancer Neg Hx    Stomach cancer Neg Hx     Social History   Socioeconomic History   Marital status: Divorced    Spouse name: Not on file   Number of children: 0   Years of education: Not on file   Highest education level: Not on file  Occupational History   Occupation: pensions consultant  Tobacco Use   Smoking status: Never   Smokeless tobacco: Never  Vaping Use   Vaping status: Never Used  Substance and Sexual Activity   Alcohol use: Not Currently    Comment: rare- once every few months   Drug use: No   Sexual activity: Not on file  Other Topics Concern   Not on file  Social History Narrative   Not on file   Social Drivers of Health   Financial Resource Strain: Not on file  Food Insecurity: Not on file  Transportation Needs: Not on file  Physical Activity: Not on file  Stress: Not on file  Social Connections: Not on file  Intimate Partner Violence: Not on file    Review of Systems:  All other review of systems negative except as mentioned in the HPI.  Physical Exam: Vital signs BP 131/63   Pulse 94   Temp 98.2 F (36.8 C)   Resp 17   Ht 6' (1.829 m)   Wt 188 lb 3.2 oz (85.4 kg)   SpO2 99%   BMI 25.52 kg/m   General:   Alert,  Well-developed, well-nourished, pleasant and cooperative in NAD Airway:  Mallampati 2 Lungs:  Clear throughout to auscultation.   Heart:  Regular rate and rhythm; no murmurs, clicks, rubs,  or gallops. Abdomen:  Soft, nontender and nondistended. Normal bowel sounds.   Neuro/Psych:  Normal mood and affect. A and O x 3  Inocente Hausen, MD [facility]

## 2024-02-02 ENCOUNTER — Encounter: Payer: Self-pay | Admitting: Pediatrics

## 2024-02-02 ENCOUNTER — Ambulatory Visit: Admitting: Pediatrics

## 2024-02-02 VITALS — BP 119/73 | HR 80 | Temp 98.2°F | Resp 13 | Ht 72.0 in | Wt 188.2 lb

## 2024-02-02 DIAGNOSIS — K5730 Diverticulosis of large intestine without perforation or abscess without bleeding: Secondary | ICD-10-CM

## 2024-02-02 DIAGNOSIS — Z1211 Encounter for screening for malignant neoplasm of colon: Secondary | ICD-10-CM

## 2024-02-02 DIAGNOSIS — D122 Benign neoplasm of ascending colon: Secondary | ICD-10-CM

## 2024-02-02 DIAGNOSIS — D120 Benign neoplasm of cecum: Secondary | ICD-10-CM | POA: Diagnosis not present

## 2024-02-02 DIAGNOSIS — Z800 Family history of malignant neoplasm of digestive organs: Secondary | ICD-10-CM

## 2024-02-02 DIAGNOSIS — K648 Other hemorrhoids: Secondary | ICD-10-CM

## 2024-02-02 DIAGNOSIS — Z860100 Personal history of colon polyps, unspecified: Secondary | ICD-10-CM

## 2024-02-02 DIAGNOSIS — Z860101 Personal history of adenomatous and serrated colon polyps: Secondary | ICD-10-CM

## 2024-02-02 MED ORDER — SODIUM CHLORIDE 0.9 % IV SOLN: 500.0000 mL | INTRAVENOUS | Status: DC

## 2024-02-02 NOTE — Progress Notes (Signed)
 Report given to PACU, vss

## 2024-02-02 NOTE — Op Note (Signed)
 [facility] Patient Name: Grant Cunningham Procedure Date: 02/02/2024 7:42 AM MRN: [id number] Endoscopist: Inocente Hausen , MD, [id number] Age: 63 Referring MD:  Date of Birth: December 17, 1960 Gender: Male Account #: [id number] Procedure:                Colonoscopy Indications:              High risk colon cancer surveillance: Personal                            history of non-advanced adenoma, Last colonoscopy:                            September 2020, Family history of colon cancer in                            multiple first-degree relatives (mother, brother) Medicines:                Monitored Anesthesia Care Procedure:                Pre-Anesthesia Assessment:                           - Prior to the procedure, a History and Physical                            was performed, and patient medications and                            allergies were reviewed. The patient's tolerance of                            previous anesthesia was also reviewed. The risks                            and benefits of the procedure and the sedation                            options and risks were discussed with the patient.                            All questions were answered, and informed consent                            was obtained. Prior Anticoagulants: The patient has                            taken no anticoagulant or antiplatelet agents. ASA                            Grade Assessment: II - A patient with mild systemic                            disease. After reviewing the risks and benefits,  the patient was deemed in satisfactory condition to                            undergo the procedure.                           After obtaining informed consent, the colonoscope                            was passed under direct vision. Throughout the                            procedure, the patient's blood pressure, pulse, and                            oxygen  saturations were monitored continuously. The                            Olympus CF-HQ190L ([id number]) Colonoscope was                            introduced through the anus and advanced to the                            cecum, identified by appendiceal orifice and                            ileocecal valve. The colonoscopy was performed                            without difficulty. The patient tolerated the                            procedure well. The quality of the bowel                            preparation was good. The ileocecal valve,                            appendiceal orifice, and rectum were photographed. Scope In: 8:01:15 AM Scope Out: 8:17:21 AM Scope Withdrawal Time: 0 hours 12 minutes 35 seconds  Total Procedure Duration: 0 hours 16 minutes 6 seconds  Findings:                 The perianal and digital rectal examinations were                            normal. Pertinent negatives include normal                            sphincter tone and no palpable rectal lesions.                           A few small-mouthed diverticula were found in the  sigmoid colon and descending colon.                           Two sessile polyps were found in the ascending                            colon and cecum. The polyps were 4 to 5 mm in size.                            These polyps were removed with a cold snare.                            Resection and retrieval were complete.                           Internal hemorrhoids were found during retroflexion. Complications:            No immediate complications. Estimated blood loss:                            Minimal. Estimated Blood Loss:     Estimated blood loss was minimal. Impression:               - Diverticulosis in the sigmoid colon and in the                            descending colon.                           - Two 4 to 5 mm polyps in the ascending colon and                            in the cecum,  removed with a cold snare. Resected                            and retrieved.                           - Internal hemorrhoids. Recommendation:           - Discharge patient to home (ambulatory).                           - Await pathology results.                           - Repeat colonoscopy in 5 years for surveillance                            given family history of colorectal cancer and                            multiple first-degree relatives.                           - The findings and recommendations were discussed  with the patient's family.                           - Patient has a contact number available for                            emergencies. The signs and symptoms of potential                            delayed complications were discussed with the                            patient. Return to normal activities tomorrow.                            Written discharge instructions were provided to the                            patient. Inocente Hausen, MD 02/02/2024 8:21:30 AM This report has been signed electronically.

## 2024-02-02 NOTE — Patient Instructions (Addendum)
 Handouts given: Polyps, Hemorrhoids, Diverticulosis Resume previous diet. Continue present medications.  Await pathology results. Repeat colonoscopy in 5 years for surveillance given family history of colorectal cancer and multiple first-degree relatives.  YOU HAD AN ENDOSCOPIC PROCEDURE TODAY AT THE [facility]:   Refer to the procedure report that was given to you for any specific questions about what was found during the examination.  If the procedure report does not answer your questions, please call your gastroenterologist to clarify.  If you requested that your care partner not be given the details of your procedure findings, then the procedure report has been included in a sealed envelope for you to review at your convenience later.  YOU SHOULD EXPECT: Some feelings of bloating in the abdomen. Passage of more gas than usual.  Walking can help get rid of the air that was put into your GI tract during the procedure and reduce the bloating. If you had a lower endoscopy (such as a colonoscopy or flexible sigmoidoscopy) you may notice spotting of blood in your stool or on the toilet paper. If you underwent a bowel prep for your procedure, you may not have a normal bowel movement for a few days.  Please Note:  You might notice some irritation and congestion in your nose or some drainage.  This is from the oxygen used during your procedure.  There is no need for concern and it should clear up in a day or so.  SYMPTOMS TO REPORT IMMEDIATELY:  Following lower endoscopy (colonoscopy or flexible sigmoidoscopy):  Excessive amounts of blood in the stool  Significant tenderness or worsening of abdominal pains  Swelling of the abdomen that is new, acute  Fever of 100F or higher  For urgent or emergent issues, a gastroenterologist can be reached at any hour by calling [phone]. Do not use MyChart messaging for urgent concerns.    DIET:  We do recommend a small meal at first, but  then you may proceed to your regular diet.  Drink plenty of fluids but you should avoid alcoholic beverages for 24 hours.  ACTIVITY:  You should plan to take it easy for the rest of today and you should NOT DRIVE or use heavy machinery until tomorrow (because of the sedation medicines used during the test).    FOLLOW UP: Our staff will call the number listed on your records the next business day following your procedure.  We will call around 7:15- 8:00 am to check on you and address any questions or concerns that you may have regarding the information given to you following your procedure. If we do not reach you, we will leave a message.     If any biopsies were taken you will be contacted by phone or by letter within the next 1-3 weeks.  Please call us  at [phone] if you have not heard about the biopsies in 3 weeks.    SIGNATURES/CONFIDENTIALITY: You and/or your care partner have signed paperwork which will be entered into your electronic medical record.  These signatures attest to the fact that that the information above on your After Visit Summary has been reviewed and is understood.  Full responsibility of the confidentiality of this discharge information lies with you and/or your care-partner.

## 2024-02-02 NOTE — Progress Notes (Signed)
 Called to room to assist during endoscopic procedure.  Patient ID and intended procedure confirmed with present staff. Received instructions for my participation in the procedure from the performing physician.

## 2024-02-05 ENCOUNTER — Telehealth: Payer: Self-pay

## 2024-02-05 NOTE — Telephone Encounter (Signed)
  Follow up Call-     02/02/2024    7:31 AM  Call back number  Post procedure Call Back phone  # [phone]  Permission to leave phone message Yes     Patient questions:  Do you have a fever, pain , or abdominal swelling? No. Pain Score  0 *  Have you tolerated food without any problems? Yes.    Have you been able to return to your normal activities? Yes.    Do you have any questions about your discharge instructions: Diet   No. Medications  No. Follow up visit  No.  Do you have questions or concerns about your Care? No.  Actions: * If pain score is 4 or above: No action needed, pain <4.

## 2024-02-06 LAB — SURGICAL PATHOLOGY

## 2024-02-07 ENCOUNTER — Ambulatory Visit: Payer: Self-pay | Admitting: Pediatrics

## 2024-03-30 ENCOUNTER — Encounter (HOSPITAL_BASED_OUTPATIENT_CLINIC_OR_DEPARTMENT_OTHER): Payer: Self-pay | Admitting: Emergency Medicine

## 2024-03-30 ENCOUNTER — Emergency Department (HOSPITAL_BASED_OUTPATIENT_CLINIC_OR_DEPARTMENT_OTHER)
Admission: EM | Admit: 2024-03-30 | Discharge: 2024-03-30 | Disposition: A | Discharge status: Home / Self Care | Attending: Emergency Medicine | Admitting: Emergency Medicine

## 2024-03-30 ENCOUNTER — Other Ambulatory Visit: Payer: Self-pay

## 2024-03-30 ENCOUNTER — Other Ambulatory Visit (HOSPITAL_BASED_OUTPATIENT_CLINIC_OR_DEPARTMENT_OTHER): Payer: Self-pay

## 2024-03-30 ENCOUNTER — Emergency Department (HOSPITAL_BASED_OUTPATIENT_CLINIC_OR_DEPARTMENT_OTHER): Admitting: Radiology

## 2024-03-30 DIAGNOSIS — W000XXA Fall on same level due to ice and snow, initial encounter: Secondary | ICD-10-CM | POA: Insufficient documentation

## 2024-03-30 DIAGNOSIS — M25512 Pain in left shoulder: Secondary | ICD-10-CM | POA: Diagnosis present

## 2024-03-30 MED ORDER — OXYCODONE-ACETAMINOPHEN 5-325 MG PO TABS
1.0000 | ORAL_TABLET | Freq: Four times a day (QID) | ORAL | 0 refills | Status: AC | PRN
  Filled 2024-03-30: qty 15, 4d supply, fill #0

## 2024-03-30 NOTE — ED Provider Notes (Signed)
 " [place] [facility] AT [facility] Provider Note   CSN: [id number] Arrival date & time: 03/30/24  1311     Patient presents with: Shoulder Injury and Fall   Grant Cunningham is a 64 y.o. male.  He is right-hand dominant.  He is complaining of acute left shoulder pain after he fell.  Slipped on ice.  Afterwards he had another fall about 15 minutes later and his arm went up over his head and he had extreme pain and felt like it dislocated.  He was able to get home and during the transport here he felt it slide back into place and the pain improved.  He currently rates it as 3 out of 10 and worse with any type of movement.  No prior history of dislocation.  Denies other injuries or complaints from a fall.  No numbness or weakness.   The history is provided by the patient.  Shoulder Injury This is a new problem. The current episode started 1 to 2 hours ago. The problem has been gradually improving. Pertinent negatives include no chest pain, no abdominal pain, no headaches and no shortness of breath. The symptoms are aggravated by bending and twisting. Nothing relieves the symptoms. He has tried rest for the symptoms. The treatment provided moderate relief.  Fall Pertinent negatives include no chest pain, no abdominal pain, no headaches and no shortness of breath.       Prior to Admission medications  Medication Sig Start Date End Date Taking? Authorizing Provider  NP THYROID 30 MG tablet Take 30 mg by mouth daily. 08/21/21   [provider]  Testosterone 10 MG/ACT (2%) GEL Place onto the skin.    [provider]  vitamin B-12 (CYANOCOBALAMIN) 100 MCG tablet Take 100 mcg by mouth daily.    [provider]    Allergies: Patient has no known allergies.    Review of Systems  Respiratory:  Negative for shortness of breath.   Cardiovascular:  Negative for chest pain.  Gastrointestinal:  Negative for abdominal pain.  Neurological:  Negative for  headaches.    Updated Vital Signs BP (!) 146/71 (BP Location: Right Arm)   Pulse 78   Temp 97.9 F (36.6 C) (Oral)   Resp 18   Ht 6' (1.829 m)   Wt 85.4 kg   SpO2 99%   BMI 25.53 kg/m   Physical Exam Vitals and nursing note reviewed.  Constitutional:      General: He is not in acute distress.    Appearance: Normal appearance. He is well-developed.  HENT:     Head: Normocephalic and atraumatic.  Eyes:     Conjunctiva/sclera: Conjunctivae normal.  Cardiovascular:     Rate and Rhythm: Normal rate and regular rhythm.     Heart sounds: No murmur heard. Pulmonary:     Effort: Pulmonary effort is normal. No respiratory distress.     Breath sounds: Normal breath sounds.  Abdominal:     Palpations: Abdomen is soft.     Tenderness: There is no abdominal tenderness.  Musculoskeletal:        General: Tenderness present. No deformity.     Cervical back: Neck supple.     Comments: Left clavicle is nontender.  He has normal internal/external rotation and normal landmarks.  There are some tenderness through the anterior left shoulder.  Axillary nerve intact.  Elbow and wrist nontender.  Distal pulses motor and sensation intact.  Skin:    General: Skin is warm and  dry.     Capillary Refill: Capillary refill takes less than 2 seconds.  Neurological:     General: No focal deficit present.     Mental Status: He is alert and oriented to person, place, and time.     Sensory: No sensory deficit.     Motor: No weakness.     (all labs ordered are listed, but only abnormal results are displayed) Labs Reviewed - No data to display  EKG: None  Radiology: DG Shoulder Left Port Result Date: 03/30/2024 CLINICAL DATA:  Injury and pain. EXAM: LEFT SHOULDER; LEFT HUMERUS - 2+ VIEW COMPARISON:  None Available. FINDINGS: Left humerus: There is no evidence of acute fracture or dislocation. Degenerative changes are noted at the glenohumeral joint. Soft tissues are unremarkable. Left shoulder: There  is no evidence of acute fracture or dislocation. Mild degenerative changes are present at the acromioclavicular and glenohumeral joints. Soft tissues are within normal limits. IMPRESSION: No acute fracture or dislocation of the left shoulder or humerus. Electronically Signed   By: Leita Birmingham M.D.   On: 03/30/2024 13:51   DG Humerus Left Result Date: 03/30/2024 CLINICAL DATA:  Injury and pain. EXAM: LEFT SHOULDER; LEFT HUMERUS - 2+ VIEW COMPARISON:  None Available. FINDINGS: Left humerus: There is no evidence of acute fracture or dislocation. Degenerative changes are noted at the glenohumeral joint. Soft tissues are unremarkable. Left shoulder: There is no evidence of acute fracture or dislocation. Mild degenerative changes are present at the acromioclavicular and glenohumeral joints. Soft tissues are within normal limits. IMPRESSION: No acute fracture or dislocation of the left shoulder or humerus. Electronically Signed   By: Leita Birmingham M.D.   On: 03/30/2024 13:51     Procedures   Medications Ordered in the ED - No data to display  Clinical Course as of 03/30/24 1616  Sat Mar 30, 2024  1344 X-ray of left shoulder and humerus show him to be located in no acute fracture.  Awaiting radiology reading. [MB]    Clinical Course User Index [MB] Towana Ozell BROCKS, MD                                 Medical Decision Making Amount and/or Complexity of Data Reviewed Radiology: ordered.  Risk Prescription drug management.   This patient complains of left shoulder injury; this involves an extensive number of treatment Options and is a complaint that carries with it a high risk of complications and morbidity. The differential includes fracture, dislocation, rotator cuff injury I ordered imaging studies which included x-rays of shoulder and humerus and I independently    visualized and interpreted imaging which showed no acute findings Additional history obtained from patient significant  other Previous records obtained and reviewed in epic no recent admissions Social determinants considered, no significant barriers Critical Interventions: None  After the interventions stated above, I reevaluated the patient and found the patient to be neurovascularly intact and pain controlled Admission and further testing considered, no indications for admission.  Will place in sling and have him follow-up outpatient with orthopedics.  Suspect he likely had a dislocation or subluxation that spontaneously reduced before he got here.  Return instructions discussed      Final diagnoses:  Acute pain of left shoulder    ED Discharge Orders          Ordered    oxyCODONE -acetaminophen  (PERCOCET/ROXICET) 5-325 MG tablet  Every 6 hours PRN  03/30/24 1402               Towana Ozell BROCKS, MD 03/30/24 1617  "

## 2024-03-30 NOTE — Discharge Instructions (Addendum)
 You were seen in the emergency department for possible dislocated and reduced shoulder.  Your x-rays did not show any fracture or dislocation.  There is possibly rotator cuff injury.  Please ice the area and use the sling for comfort.  Ibuprofen.  Avoid lifting your hand above your shoulder.  Follow-up with orthopedics as she may require further management.  Return if any worsening or concerning symptoms

## 2024-03-30 NOTE — ED Triage Notes (Signed)
 Pt via pov from home with left shoulder pain after 2 falls today. Pt states he fell once and hit the shoulder and then fell again and swung his arm and felt it dislocate. He was in severe pain and headed to ED; reports that he felt it go back into place. Pt a&o x 4; nad noted.
